# Patient Record
Sex: Female | Born: 1990 | Race: Black or African American | Hispanic: No | Marital: Single | Smoking: Never smoker
Health system: Southern US, Community
[De-identification: ages and names within clinical notes are randomized; demographics above are authoritative.]

## PROBLEM LIST (undated history)

## (undated) DIAGNOSIS — E669 Obesity, unspecified: Secondary | ICD-10-CM

## (undated) HISTORY — PX: TONSILLECTOMY AND ADENOIDECTOMY: SHX28

## (undated) HISTORY — DX: Obesity, unspecified: E66.9

---

## 2014-02-19 ENCOUNTER — Encounter (INDEPENDENT_AMBULATORY_CARE_PROVIDER_SITE_OTHER): Payer: Self-pay

## 2014-02-19 ENCOUNTER — Ambulatory Visit (INDEPENDENT_AMBULATORY_CARE_PROVIDER_SITE_OTHER): Payer: BLUE CROSS/BLUE SHIELD | Admitting: Family

## 2014-02-19 ENCOUNTER — Encounter: Payer: Self-pay | Admitting: Family

## 2014-02-19 VITALS — BP 138/84 | HR 98 | Temp 97.4°F | Ht 63.0 in | Wt 301.2 lb

## 2014-02-19 DIAGNOSIS — Z713 Dietary counseling and surveillance: Secondary | ICD-10-CM

## 2014-02-19 MED ORDER — NALTREXONE-BUPROPION HCL ER 8-90 MG PO TB12: ORAL_TABLET | ORAL | Status: DC

## 2014-02-19 NOTE — Patient Instructions (Signed)
Exercise to Lose Weight Exercise and a healthy diet may help you lose weight. Your doctor may suggest specific exercises. EXERCISE IDEAS AND TIPS  Choose low-cost things you enjoy doing, such as walking, bicycling, or exercising to workout videos.  Take stairs instead of the elevator.  Walk during your lunch break.  Park your car further away from work or school.  Go to a gym or an exercise class.  Start with 5 to 10 minutes of exercise each day. Build up to 30 minutes of exercise 4 to 6 days a week.  Wear shoes with good support and comfortable clothes.  Stretch before and after working out.  Work out until you breathe harder and your heart beats faster.  Drink extra water when you exercise.  Do not do so much that you hurt yourself, feel dizzy, or get very short of breath. Exercises that burn about 150 calories:  Running 1  miles in 15 minutes.  Playing volleyball for 45 to 60 minutes.  Washing and waxing a car for 45 to 60 minutes.  Playing touch football for 45 minutes.  Walking 1  miles in 35 minutes.  Pushing a stroller 1  miles in 30 minutes.  Playing basketball for 30 minutes.  Raking leaves for 30 minutes.  Bicycling 5 miles in 30 minutes.  Walking 2 miles in 30 minutes.  Dancing for 30 minutes.  Shoveling snow for 15 minutes.  Swimming laps for 20 minutes.  Walking up stairs for 15 minutes.  Bicycling 4 miles in 15 minutes.  Gardening for 30 to 45 minutes.  Jumping rope for 15 minutes.  Washing windows or floors for 45 to 60 minutes. Document Released: 02/20/2010 Document Revised: 04/12/2011 Document Reviewed: 02/20/2010 [employer] Patient Information 2015 [employer]. This information is not intended to replace advice given to you by your health care provider. Make sure you discuss any questions you have with your health care provider.  

## 2014-02-19 NOTE — Progress Notes (Signed)
   Subjective:    Patient ID: Daine Gip, female    DOB: 07-03-1990, 24 y.o.   MRN: [id number]  HPI Pt presents to the office for counseling for weight loss. Pt states she has tried [place] weight loss and dieting and exercising on her own with no results.  Pt would like to discuss medications to help her lose weight.    Review of Systems  Constitutional: Negative.   HENT: Negative.   Eyes: Negative.   Respiratory: Negative.  Negative for shortness of breath.   Cardiovascular: Negative.  Negative for palpitations.  Gastrointestinal: Negative.   Endocrine: Negative.   Genitourinary: Negative.   Musculoskeletal: Negative.   Neurological: Negative.  Negative for headaches.  Hematological: Negative.   Psychiatric/Behavioral: Negative.   All other systems reviewed and are negative.      Objective:   Physical Exam  Constitutional: She is oriented to person, place, and time. She appears well-developed and well-nourished. No distress.  HENT:  Head: Normocephalic and atraumatic.  Nose: Nose normal.  Mouth/Throat: Oropharynx is clear and moist.  Eyes: Pupils are equal, round, and reactive to light.  Neck: Normal range of motion. Neck supple. No thyromegaly present.  Cardiovascular: Normal rate, regular rhythm, normal heart sounds and intact distal pulses.   No murmur heard. Pulmonary/Chest: Effort normal and breath sounds normal. No respiratory distress. She has no wheezes.  Abdominal: Soft. Bowel sounds are normal. She exhibits no distension. There is no tenderness.  Musculoskeletal: Normal range of motion. She exhibits no edema or tenderness.  Neurological: She is alert and oriented to person, place, and time. She has normal reflexes. No cranial nerve deficit.  Skin: Skin is warm and dry.  Psychiatric: She has a normal mood and affect. Her behavior is normal. Judgment and thought content normal.  Vitals reviewed.  BP 138/84 mmHg  Pulse 98  Temp(Src) 97.4 F (36.3 C) (Oral)  Ht  5\' 3"  (1.6 m)  Wt 301 lb 3.2 oz (136.623 kg)  BMI 53.37 kg/m2  LMP 01/14/2014        Assessment & Plan:  1. Weight loss counseling, encounter for -Encouraged health diet and exercise-Lifestyle modifications -Will d/c medication in 12weeks if <5% weight loss -RTO 2 months - Naltrexone-Bupropion HCl ER 8-90 MG TB12; Take 1 tab PO q AM for one week, then 1 tab PO twice a day for one week, then 2 tabs in AM and 1 tab in evening for one week, then 2 tabs in AM and 2 tabs in evening. Max dose 4 tabs/day  Dispense: 120 tablet; Refill: 1  Take 1 tab PO q AM for one week, then 1 tab PO twice a day for one week, then 2 tabs in AM and 1 tab in evening for one week, then 2 tabs in AM and 2 tabs in evening. Max dose 4 tabs/day,  Evelina Dun, FNP

## 2014-02-21 ENCOUNTER — Other Ambulatory Visit: Payer: Self-pay | Admitting: Family

## 2014-02-21 MED ORDER — PHENTERMINE HCL 37.5 MG PO CAPS: 37.5000 mg | ORAL_CAPSULE | ORAL | Status: DC

## 2014-02-21 NOTE — Progress Notes (Signed)
Pt aware.

## 2014-02-27 ENCOUNTER — Encounter: Payer: Self-pay | Admitting: *Deleted

## 2014-03-01 ENCOUNTER — Telehealth: Payer: Self-pay | Admitting: Family

## 2014-03-01 NOTE — Telephone Encounter (Signed)
Paula Wells is working on now

## 2014-04-22 ENCOUNTER — Encounter: Payer: Self-pay | Admitting: Family

## 2014-04-22 ENCOUNTER — Ambulatory Visit (INDEPENDENT_AMBULATORY_CARE_PROVIDER_SITE_OTHER): Payer: BLUE CROSS/BLUE SHIELD | Admitting: Family

## 2014-04-22 VITALS — BP 132/98 | HR 70 | Temp 99.0°F | Ht 63.0 in | Wt 285.0 lb

## 2014-04-22 DIAGNOSIS — E669 Obesity, unspecified: Secondary | ICD-10-CM | POA: Diagnosis not present

## 2014-04-22 DIAGNOSIS — Z713 Dietary counseling and surveillance: Secondary | ICD-10-CM | POA: Diagnosis not present

## 2014-04-22 MED ORDER — PHENTERMINE HCL 37.5 MG PO CAPS: 37.5000 mg | ORAL_CAPSULE | ORAL | Status: DC

## 2014-04-22 NOTE — Patient Instructions (Signed)
Exercise to Lose Weight Exercise and a healthy diet may help you lose weight. Your doctor may suggest specific exercises. EXERCISE IDEAS AND TIPS  Choose low-cost things you enjoy doing, such as walking, bicycling, or exercising to workout videos.  Take stairs instead of the elevator.  Walk during your lunch break.  Park your car further away from work or school.  Go to a gym or an exercise class.  Start with 5 to 10 minutes of exercise each day. Build up to 30 minutes of exercise 4 to 6 days a week.  Wear shoes with good support and comfortable clothes.  Stretch before and after working out.  Work out until you breathe harder and your heart beats faster.  Drink extra water when you exercise.  Do not do so much that you hurt yourself, feel dizzy, or get very short of breath. Exercises that burn about 150 calories:  Running 1  miles in 15 minutes.  Playing volleyball for 45 to 60 minutes.  Washing and waxing a car for 45 to 60 minutes.  Playing touch football for 45 minutes.  Walking 1  miles in 35 minutes.  Pushing a stroller 1  miles in 30 minutes.  Playing basketball for 30 minutes.  Raking leaves for 30 minutes.  Bicycling 5 miles in 30 minutes.  Walking 2 miles in 30 minutes.  Dancing for 30 minutes.  Shoveling snow for 15 minutes.  Swimming laps for 20 minutes.  Walking up stairs for 15 minutes.  Bicycling 4 miles in 15 minutes.  Gardening for 30 to 45 minutes.  Jumping rope for 15 minutes.  Washing windows or floors for 45 to 60 minutes. Document Released: 02/20/2010 Document Revised: 04/12/2011 Document Reviewed: 02/20/2010 Exit[employer] Patient Information 2015 [employer]. This information is not intended to replace advice given to you by your health care provider. Make sure you discuss any questions you have with your health care provider. Calorie Counting for Weight Loss Calories are energy you get from the things you eat and drink. Your  body uses this energy to keep you going throughout the day. The number of calories you eat affects your weight. When you eat more calories than your body needs, your body stores the extra calories as fat. When you eat fewer calories than your body needs, your body burns fat to get the energy it needs. Calorie counting means keeping track of how many calories you eat and drink each day. If you make sure to eat fewer calories than your body needs, you should lose weight. In order for calorie counting to work, you will need to eat the number of calories that are right for you in a day to lose a healthy amount of weight per week. A healthy amount of weight to lose per week is usually 1-2 lb (0.5-0.9 kg). A dietitian can determine how many calories you need in a day and give you suggestions on how to reach your calorie goal.  WHAT IS MY MY PLAN? My goal is to have __________ calories per day.  If I have this many calories per day, I should lose around __________ pounds per week. WHAT DO I NEED TO KNOW ABOUT CALORIE COUNTING? In order to meet your daily calorie goal, you will need to:  Find out how many calories are in each food you would like to eat. Try to do this before you eat.  Decide how much of the food you can eat.  Write down what you ate and   how many calories it had. Doing this is called keeping a food log. WHERE DO I FIND CALORIE INFORMATION? The number of calories in a food can be found on a Nutrition Facts label. Note that all the information on a label is based on a specific serving of the food. If a food does not have a Nutrition Facts label, try to look up the calories online or ask your dietitian for help. HOW DO I DECIDE HOW MUCH TO EAT? To decide how much of the food you can eat, you will need to consider both the number of calories in one serving and the size of one serving. This information can be found on the Nutrition Facts label. If a food does not have a Nutrition Facts label, look  up the information online or ask your dietitian for help. Remember that calories are listed per serving. If you choose to have more than one serving of a food, you will have to multiply the calories per serving by the amount of servings you plan to eat. For example, the label on a package of bread might say that a serving size is 1 slice and that there are 90 calories in a serving. If you eat 1 slice, you will have eaten 90 calories. If you eat 2 slices, you will have eaten 180 calories. HOW DO I KEEP A FOOD LOG? After each meal, record the following information in your food log:  What you ate.  How much of it you ate.  How many calories it had.  Then, add up your calories. Keep your food log near you, such as in a small notebook in your pocket. Another option is to use a mobile app or website. Some programs will calculate calories for you and show you how many calories you have left each time you add an item to the log. WHAT ARE SOME CALORIE COUNTING TIPS?  Use your calories on foods and drinks that will fill you up and not leave you hungry. Some examples of this include foods like nuts and nut butters, vegetables, lean proteins, and high-fiber foods (more than 5 g fiber per serving).  Eat nutritious foods and avoid empty calories. Empty calories are calories you get from foods or beverages that do not have many nutrients, such as candy and soda. It is better to have a nutritious high-calorie food (such as an avocado) than a food with few nutrients (such as a bag of chips).  Know how many calories are in the foods you eat most often. This way, you do not have to look up how many calories they have each time you eat them.  Look out for foods that may seem like low-calorie foods but are really high-calorie foods, such as baked goods, soda, and fat-free candy.  Pay attention to calories in drinks. Drinks such as sodas, specialty coffee drinks, alcohol, and juices have a lot of calories yet do  not fill you up. Choose low-calorie drinks like water and diet drinks.  Focus your calorie counting efforts on higher calorie items. Logging the calories in a garden salad that contains only vegetables is less important than calculating the calories in a milk shake.  Find a way of tracking calories that works for you. Get creative. Most people who are successful find ways to keep track of how much they eat in a day, even if they do not count every calorie. WHAT ARE SOME PORTION CONTROL TIPS?  Know how many calories are in a   serving. This will help you know how many servings of a certain food you can have.  Use a measuring cup to measure serving sizes. This is helpful when you start out. With time, you will be able to estimate serving sizes for some foods.  Take some time to put servings of different foods on your favorite plates, bowls, and cups so you know what a serving looks like.  Try not to eat straight from a bag or box. Doing this can lead to overeating. Put the amount you would like to eat in a cup or on a plate to make sure you are eating the right portion.  Use smaller plates, glasses, and bowls to prevent overeating. This is a quick and easy way to practice portion control. If your plate is smaller, less food can fit on it.  Try not to multitask while eating, such as watching TV or using your computer. If it is time to eat, sit down at a table and enjoy your food. Doing this will help you to start recognizing when you are full. It will also make you more aware of what and how much you are eating. HOW CAN I CALORIE COUNT WHEN EATING OUT?  Ask for smaller portion sizes or child-sized portions.  Consider sharing an entree and sides instead of getting your own entree.  If you get your own entree, eat only half. Ask for a box at the beginning of your meal and put the rest of your entree in it so you are not tempted to eat it.  Look for the calories on the menu. If calories are listed,  choose the lower calorie options.  Choose dishes that include vegetables, fruits, whole grains, low-fat dairy products, and lean protein. Focusing on smart food choices from each of the 5 food groups can help you stay on track at restaurants.  Choose items that are boiled, broiled, grilled, or steamed.  Choose water, milk, unsweetened iced tea, or other drinks without added sugars. If you want an alcoholic beverage, choose a lower calorie option. For example, a regular margarita can have up to 700 calories and a glass of wine has around 150.  Stay away from items that are buttered, battered, fried, or served with cream sauce. Items labeled "crispy" are usually fried, unless stated otherwise.  Ask for dressings, sauces, and syrups on the side. These are usually very high in calories, so do not eat much of them.  Watch out for salads. Many people think salads are a healthy option, but this is often not the case. Many salads come with bacon, fried chicken, lots of cheese, fried chips, and dressing. All of these items have a lot of calories. If you want a salad, choose a garden salad and ask for grilled meats or steak. Ask for the dressing on the side, or ask for olive oil and vinegar or lemon to use as dressing.  Estimate how many servings of a food you are given. For example, a serving of cooked rice is  cup or about the size of half a tennis ball or one cupcake wrapper. Knowing serving sizes will help you be aware of how much food you are eating at restaurants. The list below tells you how big or small some common portion sizes are based on everyday objects.  1 oz--4 stacked dice.  3 oz--1 deck of cards.  1 tsp--1 dice.  1 Tbsp-- a Ping-Pong ball.  2 Tbsp--1 Ping-Pong ball.   cup--1 tennis ball   or 1 cupcake wrapper.  1 cup--1 baseball. Document Released: 01/18/2005 Document Revised: 06/04/2013 Document Reviewed: 11/23/2012 [facility] Patient Information 2015 [place], [place]. This  information is not intended to replace advice given to you by your health care provider. Make sure you discuss any questions you have with your health care provider. Phentermine tablets or capsules What is this medicine? PHENTERMINE (FEN ter meen) decreases your appetite. It is used with a reduced calorie diet and exercise to help you lose weight. This medicine may be used for other purposes; ask your health care provider or pharmacist if you have questions. COMMON BRAND NAME(S): Adipex-P, Atti-Plex P, Atti-Plex P Spansule, Fastin, Pro-Fast, Tara-8 What should I tell my health care provider before I take this medicine? They need to know if you have any of these conditions: -agitation -glaucoma -heart disease -high blood pressure -history of substance abuse -lung disease called Primary Pulmonary Hypertension (PPH) -taken an MAOI like Carbex, Eldepryl, Marplan, Nardil, or Parnate in last 14 days -thyroid disease -an unusual or allergic reaction to phentermine, other medicines, foods, dyes, or preservatives -pregnant or trying to get pregnant -breast-feeding How should I use this medicine? Take this medicine by mouth with a glass of water. Follow the directions on the prescription label. This medicine is usually taken 30 minutes before or 1 to 2 hours after breakfast. Avoid taking this medicine in the evening. It may interfere with sleep. Take your doses at regular intervals. Do not take your medicine more often than directed. Talk to your pediatrician regarding the use of this medicine in children. Special care may be needed. Overdosage: If you think you have taken too much of this medicine contact a poison control center or emergency room at once. NOTE: This medicine is only for you. Do not share this medicine with others. What if I miss a dose? If you miss a dose, take it as soon as you can. If it is almost time for your next dose, take only that dose. Do not take double or extra  doses. What may interact with this medicine? Do not take this medicine with any of the following medications: -duloxetine -MAOIs like Carbex, Eldepryl, Marplan, Nardil, and Parnate -medicines for colds or breathing difficulties like pseudoephedrine or phenylephrine -procarbazine -sibutramine -SSRIs like citalopram, escitalopram, fluoxetine, fluvoxamine, paroxetine, and sertraline -stimulants like dexmethylphenidate, methylphenidate or modafinil -venlafaxine This medicine may also interact with the following medications: -medicines for diabetes This list may not describe all possible interactions. Give your health care provider a list of all the medicines, herbs, non-prescription drugs, or dietary supplements you use. Also tell them if you smoke, drink alcohol, or use illegal drugs. Some items may interact with your medicine. What should I watch for while using this medicine? Notify your physician immediately if you become short of breath while doing your normal activities. Do not take this medicine within 6 hours of bedtime. It can keep you from getting to sleep. Avoid drinks that contain caffeine and try to stick to a regular bedtime every night. This medicine was intended to be used in addition to a healthy diet and exercise. The best results are achieved this way. This medicine is only indicated for short-term use. Eventually your weight loss may level out. At that point, the drug will only help you maintain your new weight. Do not increase or in any way change your dose without consulting your doctor. You may get drowsy or dizzy. Do not drive, use machinery, or do anything that needs mental alertness  until you know how this medicine affects you. Do not stand or sit up quickly, especially if you are an older patient. This reduces the risk of dizzy or fainting spells. Alcohol may increase dizziness and drowsiness. Avoid alcoholic drinks. What side effects may I notice from receiving this  medicine? Side effects that you should report to your doctor or health care professional as soon as possible: -chest pain, palpitations -depression or severe changes in mood -increased blood pressure -irritability -nervousness or restlessness -severe dizziness -shortness of breath -problems urinating -unusual swelling of the legs -vomiting Side effects that usually do not require medical attention (report to your doctor or health care professional if they continue or are bothersome): -blurred vision or other eye problems -changes in sexual ability or desire -constipation or diarrhea -difficulty sleeping -dry mouth or unpleasant taste -headache -nausea This list may not describe all possible side effects. Call your doctor for medical advice about side effects. You may report side effects to FDA at 1-800-FDA-1088. Where should I keep my medicine? Keep out of the reach of children. This medicine can be abused. Keep your medicine in a safe place to protect it from theft. Do not share this medicine with anyone. Selling or giving away this medicine is dangerous and against the law. Store at room temperature between 20 and 25 degrees C (68 and 77 degrees F). Keep container tightly closed. Throw away any unused medicine after the expiration date. NOTE: This sheet is a summary. It may not cover all possible information. If you have questions about this medicine, talk to your doctor, pharmacist, or health care provider.  2015, [employer]. (2010-03-04 11:02:44)

## 2014-04-22 NOTE — Progress Notes (Signed)
   Subjective:    Patient ID: Paula Wells, female    DOB: 08/07/1990, 24 y.o.   MRN: [id number]  HPI Pt presents to the office today to discussed how her weight loss has been going since being started on phentermine 37.5 mg. Pt states she has lost 16 lbs since starting the medication. Pt states she is exercising at least every other day. Pt states she is also trying to eat healthy.    Review of Systems  Constitutional: Negative.   HENT: Negative.   Eyes: Negative.   Respiratory: Negative.  Negative for shortness of breath.   Cardiovascular: Negative.  Negative for palpitations.  Gastrointestinal: Negative.   Endocrine: Negative.   Genitourinary: Negative.   Musculoskeletal: Negative.   Neurological: Negative.  Negative for headaches.  Hematological: Negative.   Psychiatric/Behavioral: Negative.   All other systems reviewed and are negative.      Objective:   Physical Exam  Constitutional: She is oriented to person, place, and time. She appears well-developed and well-nourished. No distress.  Eyes: Pupils are equal, round, and reactive to light.  Neck: Normal range of motion. Neck supple. No thyromegaly present.  Cardiovascular: Normal rate, regular rhythm, normal heart sounds and intact distal pulses.   No murmur heard. Pulmonary/Chest: Effort normal and breath sounds normal. No respiratory distress. She has no wheezes.  Abdominal: Soft. Bowel sounds are normal. She exhibits no distension. There is no tenderness.  Musculoskeletal: Normal range of motion. She exhibits no edema or tenderness.  Neurological: She is alert and oriented to person, place, and time. She has normal reflexes. No cranial nerve deficit.  Skin: Skin is warm and dry.  Psychiatric: She has a normal mood and affect. Her behavior is normal. Judgment and thought content normal.  Vitals reviewed.    BP 132/98 mmHg  Pulse 70  Temp(Src) 99 F (37.2 C) (Oral)  Ht $R'5\' 3"'lg$  (1.6 m)  Wt 285 lb (129.275 kg)   BMI 50.50 kg/m2  LMP 04/15/2014      Assessment & Plan:  1. Obesity - phentermine 37.5 MG capsule; Take 1 capsule (37.5 mg total) by mouth every morning.  Dispense: 90 capsule; Refill: 0 - CMP14+EGFR  2. Encounter for weight loss counseling -Encouraged healthy eating and diet -RTO in 3 months- if less than 5% of weight loss pt can not have medication refill - phentermine 37.5 MG capsule; Take 1 capsule (37.5 mg total) by mouth every morning.  Dispense: 90 capsule; Refill: 0 - CMP14+EGFR    Continue all meds Labs pending Health Maintenance reviewed Diet and exercise encouraged RTO 3 months  Evelina Dun, FNP

## 2014-04-23 ENCOUNTER — Ambulatory Visit: Payer: BLUE CROSS/BLUE SHIELD | Admitting: Family

## 2014-04-23 LAB — CMP14+EGFR
A/G RATIO: 1.5 (ref 1.1–2.5)
ALBUMIN: 4 g/dL (ref 3.5–5.5)
ALK PHOS: 79 IU/L (ref 39–117)
ALT: 30 IU/L (ref 0–32)
AST: 22 IU/L (ref 0–40)
BILIRUBIN TOTAL: 0.2 mg/dL (ref 0.0–1.2)
BUN / CREAT RATIO: 23 — AB (ref 8–20)
BUN: 14 mg/dL (ref 6–20)
CALCIUM: 9.4 mg/dL (ref 8.7–10.2)
CO2: 24 mmol/L (ref 18–29)
Chloride: 101 mmol/L (ref 97–108)
Creatinine, Ser: 0.62 mg/dL (ref 0.57–1.00)
GFR calc Af Amer: 147 mL/min/{1.73_m2} (ref >59)
GFR, EST NON AFRICAN AMERICAN: 128 mL/min/{1.73_m2} (ref >59)
GLOBULIN, TOTAL: 2.7 g/dL (ref 1.5–4.5)
GLUCOSE: 104 mg/dL — AB (ref 65–99)
POTASSIUM: 4.6 mmol/L (ref 3.5–5.2)
Sodium: 138 mmol/L (ref 134–144)
Total Protein: 6.7 g/dL (ref 6.0–8.5)

## 2014-06-03 ENCOUNTER — Telehealth: Payer: Self-pay | Admitting: Family

## 2014-06-03 DIAGNOSIS — E669 Obesity, unspecified: Secondary | ICD-10-CM

## 2014-06-03 DIAGNOSIS — Z713 Dietary counseling and surveillance: Secondary | ICD-10-CM

## 2014-06-03 MED ORDER — PHENTERMINE HCL 37.5 MG PO CAPS: 37.5000 mg | ORAL_CAPSULE | ORAL | Status: DC

## 2014-06-03 NOTE — Telephone Encounter (Signed)
RX ready for pick up 

## 2014-06-03 NOTE — Telephone Encounter (Signed)
Patient aware rx ready to be picked up 

## 2014-06-11 ENCOUNTER — Telehealth: Payer: Self-pay

## 2014-06-11 NOTE — Telephone Encounter (Signed)
Patient aware.

## 2014-06-11 NOTE — Telephone Encounter (Signed)
Insurance denied prior authorization for Phentermine due to the requested medication is covered when the patient has lost greater than or equal to 5% of baseline body weight   I called [facility] and a 30 day supply self pay is $27.00

## 2014-06-11 NOTE — Telephone Encounter (Signed)
Please let pt know. Thanks.

## 2014-07-23 ENCOUNTER — Ambulatory Visit: Payer: BLUE CROSS/BLUE SHIELD | Admitting: Family

## 2014-08-01 ENCOUNTER — Ambulatory Visit: Payer: BLUE CROSS/BLUE SHIELD | Admitting: Family

## 2014-08-08 ENCOUNTER — Ambulatory Visit (INDEPENDENT_AMBULATORY_CARE_PROVIDER_SITE_OTHER): Payer: BLUE CROSS/BLUE SHIELD | Admitting: Family

## 2014-08-08 ENCOUNTER — Encounter: Payer: Self-pay | Admitting: Family

## 2014-08-08 VITALS — BP 125/86 | HR 69 | Temp 98.2°F | Ht 63.0 in | Wt 270.4 lb

## 2014-08-08 DIAGNOSIS — Z713 Dietary counseling and surveillance: Secondary | ICD-10-CM | POA: Diagnosis not present

## 2014-08-08 DIAGNOSIS — E669 Obesity, unspecified: Secondary | ICD-10-CM | POA: Diagnosis not present

## 2014-08-08 MED ORDER — PHENTERMINE HCL 37.5 MG PO CAPS
37.5000 mg | ORAL_CAPSULE | ORAL | Status: DC
Start: 2014-08-08 — End: 2015-03-20

## 2014-08-08 NOTE — Patient Instructions (Signed)
Health Maintenance Adopting a healthy lifestyle and getting preventive care can go a long way to promote health and wellness. Talk with your health care provider about what schedule of regular examinations is right for you. This is a good chance for you to check in with your provider about disease prevention and staying healthy. In between checkups, there are plenty of things you can do on your own. Experts have done a lot of research about which lifestyle changes and preventive measures are most likely to keep you healthy. Ask your health care provider for more information. WEIGHT AND DIET  Eat a healthy diet  Be sure to include plenty of vegetables, fruits, low-fat dairy products, and lean protein.  Do not eat a lot of foods high in solid fats, added sugars, or salt.  Get regular exercise. This is one of the most important things you can do for your health.  Most adults should exercise for at least 150 minutes each week. The exercise should increase your heart rate and make you sweat (moderate-intensity exercise).  Most adults should also do strengthening exercises at least twice a week. This is in addition to the moderate-intensity exercise.  Maintain a healthy weight  Body mass index (BMI) is a measurement that can be used to identify possible weight problems. It estimates body fat based on height and weight. Your health care provider can help determine your BMI and help you achieve or maintain a healthy weight.  For females 25 years of age and older:   A BMI below 18.5 is considered underweight.  A BMI of 18.5 to 24.9 is normal.  A BMI of 25 to 29.9 is considered overweight.  A BMI of 30 and above is considered obese.  Watch levels of cholesterol and blood lipids  You should start having your blood tested for lipids and cholesterol at 23 years of age, then have this test every 5 years.  You may need to have your cholesterol levels checked more often if:  Your lipid or  cholesterol levels are high.  You are older than 23 years of age.  You are at high risk for heart disease.  CANCER SCREENING   Lung Cancer  Lung cancer screening is recommended for adults 97-92 years old who are at high risk for lung cancer because of a history of smoking.  A yearly low-dose CT scan of the lungs is recommended for people who:  Currently smoke.  Have quit within the past 15 years.  Have at least a 30-pack-year history of smoking. A pack year is smoking an average of one pack of cigarettes a day for 1 year.  Yearly screening should continue until it has been 15 years since you quit.  Yearly screening should stop if you develop a health problem that would prevent you from having lung cancer treatment.  Breast Cancer  Practice breast self-awareness. This means understanding how your breasts normally appear and feel.  It also means doing regular breast self-exams. Let your health care provider know about any changes, no matter how small.  If you are in your 20s or 30s, you should have a clinical breast exam (CBE) by a health care provider every 1-3 years as part of a regular health exam.  If you are 76 or older, have a CBE every year. Also consider having a breast X-ray (mammogram) every year.  If you have a family history of breast cancer, talk to your health care provider about genetic screening.  If you are  at high risk for breast cancer, talk to your health care provider about having an MRI and a mammogram every year.  Breast cancer gene (BRCA) assessment is recommended for women who have family members with BRCA-related cancers. BRCA-related cancers include:  Breast.  Ovarian.  Tubal.  Peritoneal cancers.  Results of the assessment will determine the need for genetic counseling and BRCA1 and BRCA2 testing. Cervical Cancer Routine pelvic examinations to screen for cervical cancer are no longer recommended for nonpregnant women who are considered low  risk for cancer of the pelvic organs (ovaries, uterus, and vagina) and who do not have symptoms. A pelvic examination may be necessary if you have symptoms including those associated with pelvic infections. Ask your health care provider if a screening pelvic exam is right for you.   The Pap test is the screening test for cervical cancer for women who are considered at risk.  If you had a hysterectomy for a problem that was not cancer or a condition that could lead to cancer, then you no longer need Pap tests.  If you are older than 65 years, and you have had normal Pap tests for the past 10 years, you no longer need to have Pap tests.  If you have had past treatment for cervical cancer or a condition that could lead to cancer, you need Pap tests and screening for cancer for at least 20 years after your treatment.  If you no longer get a Pap test, assess your risk factors if they change (such as having a new sexual partner). This can affect whether you should start being screened again.  Some women have medical problems that increase their chance of getting cervical cancer. If this is the case for you, your health care provider may recommend more frequent screening and Pap tests.  The human papillomavirus (HPV) test is another test that may be used for cervical cancer screening. The HPV test looks for the virus that can cause cell changes in the cervix. The cells collected during the Pap test can be tested for HPV.  The HPV test can be used to screen women 30 years of age and older. Getting tested for HPV can extend the interval between normal Pap tests from three to five years.  An HPV test also should be used to screen women of any age who have unclear Pap test results.  After 23 years of age, women should have HPV testing as often as Pap tests.  Colorectal Cancer  This type of cancer can be detected and often prevented.  Routine colorectal cancer screening usually begins at 23 years of  age and continues through 23 years of age.  Your health care provider may recommend screening at an earlier age if you have risk factors for colon cancer.  Your health care provider may also recommend using home test kits to check for hidden blood in the stool.  A small camera at the end of a tube can be used to examine your colon directly (sigmoidoscopy or colonoscopy). This is done to check for the earliest forms of colorectal cancer.  Routine screening usually begins at age 50.  Direct examination of the colon should be repeated every 5-10 years through 23 years of age. However, you may need to be screened more often if early forms of precancerous polyps or small growths are found. Skin Cancer  Check your skin from head to toe regularly.  Tell your health care provider about any new moles or changes in   moles, especially if there is a change in a mole's shape or color.  Also tell your health care provider if you have a mole that is larger than the size of a pencil eraser.  Always use sunscreen. Apply sunscreen liberally and repeatedly throughout the day.  Protect yourself by wearing long sleeves, pants, a wide-brimmed hat, and sunglasses whenever you are outside. HEART DISEASE, DIABETES, AND HIGH BLOOD PRESSURE   Have your blood pressure checked at least every 1-2 years. High blood pressure causes heart disease and increases the risk of stroke.  If you are between 75 years and 42 years old, ask your health care provider if you should take aspirin to prevent strokes.  Have regular diabetes screenings. This involves taking a blood sample to check your fasting blood sugar level.  If you are at a normal weight and have a low risk for diabetes, have this test once every three years after 23 years of age.  If you are overweight and have a high risk for diabetes, consider being tested at a younger age or more often. PREVENTING INFECTION  Hepatitis B  If you have a higher risk for  hepatitis B, you should be screened for this virus. You are considered at high risk for hepatitis B if:  You were born in a country where hepatitis B is common. Ask your health care provider which countries are considered high risk.  Your parents were born in a high-risk country, and you have not been immunized against hepatitis B (hepatitis B vaccine).  You have HIV or AIDS.  You use needles to inject street drugs.  You live with someone who has hepatitis B.  You have had sex with someone who has hepatitis B.  You get hemodialysis treatment.  You take certain medicines for conditions, including cancer, organ transplantation, and autoimmune conditions. Hepatitis C  Blood testing is recommended for:  Everyone born from [birth year] through 1964.  Anyone with known risk factors for hepatitis C. Sexually transmitted infections (STIs)  You should be screened for sexually transmitted infections (STIs) including gonorrhea and chlamydia if:  You are sexually active and are younger than 23 years of age.  You are older than 23 years of age and your health care provider tells you that you are at risk for this type of infection.  Your sexual activity has changed since you were last screened and you are at an increased risk for chlamydia or gonorrhea. Ask your health care provider if you are at risk.  If you do not have HIV, but are at risk, it may be recommended that you take a prescription medicine daily to prevent HIV infection. This is called pre-exposure prophylaxis (PrEP). You are considered at risk if:  You are sexually active and do not regularly use condoms or know the HIV status of your partner(s).  You take drugs by injection.  You are sexually active with a partner who has HIV. Talk with your health care provider about whether you are at high risk of being infected with HIV. If you choose to begin PrEP, you should first be tested for HIV. You should then be tested every 3 months for  as long as you are taking PrEP.  PREGNANCY   If you are premenopausal and you may become pregnant, ask your health care provider about preconception counseling.  If you may become pregnant, take 400 to 800 micrograms (mcg) of folic acid every day.  If you want to prevent pregnancy, talk to your  health care provider about birth control (contraception). OSTEOPOROSIS AND MENOPAUSE   Osteoporosis is a disease in which the bones lose minerals and strength with aging. This can result in serious bone fractures. Your risk for osteoporosis can be identified using a bone density scan.  If you are 64 years of age or older, or if you are at risk for osteoporosis and fractures, ask your health care provider if you should be screened.  Ask your health care provider whether you should take a calcium or vitamin D supplement to lower your risk for osteoporosis.  Menopause may have certain physical symptoms and risks.  Hormone replacement therapy may reduce some of these symptoms and risks. Talk to your health care provider about whether hormone replacement therapy is right for you.  HOME CARE INSTRUCTIONS   Schedule regular health, dental, and eye exams.  Stay current with your immunizations.   Do not use any tobacco products including cigarettes, chewing tobacco, or electronic cigarettes.  If you are pregnant, do not drink alcohol.  If you are breastfeeding, limit how much and how often you drink alcohol.  Limit alcohol intake to no more than 1 drink per day for nonpregnant women. One drink equals 12 ounces of beer, 5 ounces of wine, or 1 ounces of hard liquor.  Do not use street drugs.  Do not share needles.  Ask your health care provider for help if you need support or information about quitting drugs.  Tell your health care provider if you often feel depressed.  Tell your health care provider if you have ever been abused or do not feel safe at home. Document Released: 08/03/2010  Document Revised: 06/04/2013 Document Reviewed: 12/20/2012 [facility] Patient Information 2015 [place], [place]. This information is not intended to replace advice given to you by your health care provider. Make sure you discuss any questions you have with your health care provider. Calorie Counting for Weight Loss Calories are energy you get from the things you eat and drink. Your body uses this energy to keep you going throughout the day. The number of calories you eat affects your weight. When you eat more calories than your body needs, your body stores the extra calories as fat. When you eat fewer calories than your body needs, your body burns fat to get the energy it needs. Calorie counting means keeping track of how many calories you eat and drink each day. If you make sure to eat fewer calories than your body needs, you should lose weight. In order for calorie counting to work, you will need to eat the number of calories that are right for you in a day to lose a healthy amount of weight per week. A healthy amount of weight to lose per week is usually 1-2 lb (0.5-0.9 kg). A dietitian can determine how many calories you need in a day and give you suggestions on how to reach your calorie goal.  WHAT IS MY MY PLAN? My goal is to have __________ calories per day.  If I have this many calories per day, I should lose around __________ pounds per week. WHAT DO I NEED TO KNOW ABOUT CALORIE COUNTING? In order to meet your daily calorie goal, you will need to:  Find out how many calories are in each food you would like to eat. Try to do this before you eat.  Decide how much of the food you can eat.  Write down what you ate and how many calories it had. Doing this  is called keeping a food log. WHERE DO I FIND CALORIE INFORMATION? The number of calories in a food can be found on a Nutrition Facts label. Note that all the information on a label is based on a specific serving of the food. If a food does not  have a Nutrition Facts label, try to look up the calories online or ask your dietitian for help. HOW DO I DECIDE HOW MUCH TO EAT? To decide how much of the food you can eat, you will need to consider both the number of calories in one serving and the size of one serving. This information can be found on the Nutrition Facts label. If a food does not have a Nutrition Facts label, look up the information online or ask your dietitian for help. Remember that calories are listed per serving. If you choose to have more than one serving of a food, you will have to multiply the calories per serving by the amount of servings you plan to eat. For example, the label on a package of bread might say that a serving size is 1 slice and that there are 90 calories in a serving. If you eat 1 slice, you will have eaten 90 calories. If you eat 2 slices, you will have eaten 180 calories. HOW DO I KEEP A FOOD LOG? After each meal, record the following information in your food log:  What you ate.  How much of it you ate.  How many calories it had.  Then, add up your calories. Keep your food log near you, such as in a small notebook in your pocket. Another option is to use a mobile app or website. Some programs will calculate calories for you and show you how many calories you have left each time you add an item to the log. WHAT ARE SOME CALORIE COUNTING TIPS?  Use your calories on foods and drinks that will fill you up and not leave you hungry. Some examples of this include foods like nuts and nut butters, vegetables, lean proteins, and high-fiber foods (more than 5 g fiber per serving).  Eat nutritious foods and avoid empty calories. Empty calories are calories you get from foods or beverages that do not have many nutrients, such as candy and soda. It is better to have a nutritious high-calorie food (such as an avocado) than a food with few nutrients (such as a bag of chips).  Know how many calories are in the foods  you eat most often. This way, you do not have to look up how many calories they have each time you eat them.  Look out for foods that may seem like low-calorie foods but are really high-calorie foods, such as baked goods, soda, and fat-free candy.  Pay attention to calories in drinks. Drinks such as sodas, specialty coffee drinks, alcohol, and juices have a lot of calories yet do not fill you up. Choose low-calorie drinks like water and diet drinks.  Focus your calorie counting efforts on higher calorie items. Logging the calories in a garden salad that contains only vegetables is less important than calculating the calories in a milk shake.  Find a way of tracking calories that works for you. Get creative. Most people who are successful find ways to keep track of how much they eat in a day, even if they do not count every calorie. WHAT ARE SOME PORTION CONTROL TIPS?  Know how many calories are in a serving. This will help you know how  many servings of a certain food you can have.  Use a measuring cup to measure serving sizes. This is helpful when you start out. With time, you will be able to estimate serving sizes for some foods.  Take some time to put servings of different foods on your favorite plates, bowls, and cups so you know what a serving looks like.  Try not to eat straight from a bag or box. Doing this can lead to overeating. Put the amount you would like to eat in a cup or on a plate to make sure you are eating the right portion.  Use smaller plates, glasses, and bowls to prevent overeating. This is a quick and easy way to practice portion control. If your plate is smaller, less food can fit on it.  Try not to multitask while eating, such as watching TV or using your computer. If it is time to eat, sit down at a table and enjoy your food. Doing this will help you to start recognizing when you are full. It will also make you more aware of what and how much you are eating. HOW CAN I  CALORIE COUNT WHEN EATING OUT?  Ask for smaller portion sizes or child-sized portions.  Consider sharing an entree and sides instead of getting your own entree.  If you get your own entree, eat only half. Ask for a box at the beginning of your meal and put the rest of your entree in it so you are not tempted to eat it.  Look for the calories on the menu. If calories are listed, choose the lower calorie options.  Choose dishes that include vegetables, fruits, whole grains, low-fat dairy products, and lean protein. Focusing on smart food choices from each of the 5 food groups can help you stay on track at restaurants.  Choose items that are boiled, broiled, grilled, or steamed.  Choose water, milk, unsweetened iced tea, or other drinks without added sugars. If you want an alcoholic beverage, choose a lower calorie option. For example, a regular margarita can have up to 700 calories and a glass of wine has around 150.  Stay away from items that are buttered, battered, fried, or served with cream sauce. Items labeled "crispy" are usually fried, unless stated otherwise.  Ask for dressings, sauces, and syrups on the side. These are usually very high in calories, so do not eat much of them.  Watch out for salads. Many people think salads are a healthy option, but this is often not the case. Many salads come with bacon, fried chicken, lots of cheese, fried chips, and dressing. All of these items have a lot of calories. If you want a salad, choose a garden salad and ask for grilled meats or steak. Ask for the dressing on the side, or ask for olive oil and vinegar or lemon to use as dressing.  Estimate how many servings of a food you are given. For example, a serving of cooked rice is  cup or about the size of half a tennis ball or one cupcake wrapper. Knowing serving sizes will help you be aware of how much food you are eating at restaurants. The list below tells you how big or small some common  portion sizes are based on everyday objects.  1 oz--4 stacked dice.  3 oz--1 deck of cards.  1 tsp--1 dice.  1 Tbsp-- a Ping-Pong ball.  2 Tbsp--1 Ping-Pong ball.   cup--1 tennis ball or 1 cupcake wrapper.  1  cup--1 baseball. Document Released: 01/18/2005 Document Revised: 06/04/2013 Document Reviewed: 11/23/2012 [facility] Patient Information 2015 [place], [place]. This information is not intended to replace advice given to you by your health care provider. Make sure you discuss any questions you have with your health care provider.

## 2014-08-08 NOTE — Progress Notes (Signed)
   Subjective:    Patient ID: Paula Wells, female    DOB: 10/07/1990, 24 y.o.   MRN: [id number]  HPI Pt presents to the office today to discussed how her weight loss has been going since being started on phentermine 37.5 mg. Pt states she has lost 15 lbs since her last visit and 31 lb since starting the medication. Pt states she is exercising at least every other day. Pt states she is also trying to eat healthy.    Review of Systems  Constitutional: Negative.   HENT: Negative.   Eyes: Negative.   Respiratory: Negative.  Negative for shortness of breath.   Cardiovascular: Negative.  Negative for palpitations.  Gastrointestinal: Negative.   Endocrine: Negative.   Genitourinary: Negative.   Musculoskeletal: Negative.   Neurological: Negative.  Negative for headaches.  Hematological: Negative.   Psychiatric/Behavioral: Negative.   All other systems reviewed and are negative.      Objective:   Physical Exam  Constitutional: She is oriented to person, place, and time. She appears well-developed and well-nourished. No distress.  HENT:  Head: Normocephalic and atraumatic.  Right Ear: External ear normal.  Left Ear: External ear normal.  Nose: Nose normal.  Mouth/Throat: Oropharynx is clear and moist.  Eyes: Pupils are equal, round, and reactive to light.  Neck: Normal range of motion. Neck supple. No thyromegaly present.  Cardiovascular: Normal rate, regular rhythm, normal heart sounds and intact distal pulses.   No murmur heard. Pulmonary/Chest: Effort normal and breath sounds normal. No respiratory distress. She has no wheezes.  Abdominal: Soft. Bowel sounds are normal. She exhibits no distension. There is no tenderness.  Musculoskeletal: Normal range of motion. She exhibits no edema or tenderness.  Neurological: She is alert and oriented to person, place, and time. She has normal reflexes. No cranial nerve deficit.  Skin: Skin is warm and dry.  Psychiatric: She has a normal  mood and affect. Her behavior is normal. Judgment and thought content normal.  Vitals reviewed.     BP 125/86 mmHg  Pulse 69  Temp(Src) 98.2 F (36.8 C) (Oral)  Ht $R'5\' 3"'fr$  (1.6 m)  Wt 270 lb 6.4 oz (122.653 kg)  BMI 47.91 kg/m2  LMP 08/07/2014     Assessment & Plan:  1. Obesity - phentermine 37.5 MG capsule; Take 1 capsule (37.5 mg total) by mouth every morning.  Dispense: 90 capsule; Refill: 0 - CMP14+EGFR  2. Encounter for weight loss counseling -Encourage pt to continue exercising and healthy diet - phentermine 37.5 MG capsule; Take 1 capsule (37.5 mg total) by mouth every morning.  Dispense: 90 capsule; Refill: 0 - CMP14+EGFR   Continue all meds Labs pending Health Maintenance reviewed Diet and exercise encouraged RTO 3 months   Evelina Dun, FNP

## 2014-08-09 LAB — CMP14+EGFR
ALT: 17 IU/L (ref 0–32)
AST: 16 IU/L (ref 0–40)
Albumin/Globulin Ratio: 1.8 (ref 1.1–2.5)
Albumin: 4 g/dL (ref 3.5–5.5)
Alkaline Phosphatase: 66 IU/L (ref 39–117)
BUN/Creatinine Ratio: 19 (ref 8–20)
BUN: 12 mg/dL (ref 6–20)
Bilirubin Total: 0.4 mg/dL (ref 0.0–1.2)
CO2: 25 mmol/L (ref 18–29)
Calcium: 9.2 mg/dL (ref 8.7–10.2)
Chloride: 100 mmol/L (ref 97–108)
Creatinine, Ser: 0.62 mg/dL (ref 0.57–1.00)
GFR calc Af Amer: 147 mL/min/{1.73_m2} (ref >59)
GFR calc non Af Amer: 128 mL/min/{1.73_m2} (ref >59)
Globulin, Total: 2.2 g/dL (ref 1.5–4.5)
Glucose: 99 mg/dL (ref 65–99)
Potassium: 4 mmol/L (ref 3.5–5.2)
Sodium: 140 mmol/L (ref 134–144)
Total Protein: 6.2 g/dL (ref 6.0–8.5)

## 2014-09-19 ENCOUNTER — Telehealth: Payer: Self-pay | Admitting: Family

## 2014-10-18 ENCOUNTER — Telehealth: Payer: Self-pay | Admitting: Family

## 2014-10-18 NOTE — Telephone Encounter (Signed)
Patient aware that we have not received the prior authorization from [facility].

## 2014-11-11 ENCOUNTER — Ambulatory Visit: Payer: BLUE CROSS/BLUE SHIELD | Admitting: Family

## 2014-11-21 ENCOUNTER — Ambulatory Visit: Payer: BLUE CROSS/BLUE SHIELD | Admitting: Family

## 2014-12-16 ENCOUNTER — Telehealth: Payer: Self-pay

## 2014-12-16 NOTE — Telephone Encounter (Signed)
Insurance denied Phentermine    Says requested medication is covered when the patient lost greater than or equal to 5% baseline body weight

## 2014-12-16 NOTE — Telephone Encounter (Signed)
lmtcb to inform pt that ins will not pay for phentermine, that she could pay for it out of pocket.

## 2014-12-16 NOTE — Telephone Encounter (Signed)
Please let patient know that Insurance denied Phentermine Says requested medication is covered when the patient lost greater than or equal to 5% baseline body weight

## 2015-03-20 ENCOUNTER — Ambulatory Visit (INDEPENDENT_AMBULATORY_CARE_PROVIDER_SITE_OTHER): Payer: BLUE CROSS/BLUE SHIELD | Admitting: Family

## 2015-03-20 ENCOUNTER — Encounter: Payer: Self-pay | Admitting: Family

## 2015-03-20 VITALS — BP 111/75 | HR 63 | Temp 97.2°F | Ht 63.0 in | Wt 250.6 lb

## 2015-03-20 DIAGNOSIS — Z713 Dietary counseling and surveillance: Secondary | ICD-10-CM

## 2015-03-20 DIAGNOSIS — Z23 Encounter for immunization: Secondary | ICD-10-CM | POA: Diagnosis not present

## 2015-03-20 DIAGNOSIS — E669 Obesity, unspecified: Secondary | ICD-10-CM

## 2015-03-20 MED ORDER — PHENTERMINE HCL 37.5 MG PO CAPS: 37.5000 mg | ORAL_CAPSULE | ORAL | Status: DC

## 2015-03-20 NOTE — Progress Notes (Signed)
   Subjective:    Patient ID: Paula Wells, female    DOB: 05/20/1990, 25 y.o.   MRN: [id number]  HPI  Pt presents to the office today to discussed weight loss. Pt started losing weight in 2016 and lost a total of 61 lbs. Pt states she was takng phentermine 37.5 mg that helped, but has not taken any since September. PT states since stopping phentermine she has only lost a few pounds. PT would like to restart the phentermine today,     Review of Systems  Constitutional: Negative.   HENT: Negative.   Eyes: Negative.   Respiratory: Negative.  Negative for shortness of breath.   Cardiovascular: Negative.  Negative for palpitations.  Gastrointestinal: Negative.   Endocrine: Negative.   Genitourinary: Negative.   Musculoskeletal: Negative.   Neurological: Negative.  Negative for headaches.  Hematological: Negative.   Psychiatric/Behavioral: Negative.   All other systems reviewed and are negative.      Objective:   Physical Exam  Constitutional: She is oriented to person, place, and time. She appears well-developed and well-nourished. No distress.  Obese   HENT:  Head: Normocephalic and atraumatic.  Right Ear: External ear normal.  Left Ear: External ear normal.  Nose: Nose normal.  Mouth/Throat: Oropharynx is clear and moist.  Eyes: Pupils are equal, round, and reactive to light.  Neck: Normal range of motion. Neck supple. No thyromegaly present.  Cardiovascular: Normal rate, regular rhythm, normal heart sounds and intact distal pulses.   No murmur heard. Pulmonary/Chest: Effort normal and breath sounds normal. No respiratory distress. She has no wheezes.  Abdominal: Soft. Bowel sounds are normal. She exhibits no distension. There is no tenderness.  Musculoskeletal: Normal range of motion. She exhibits no edema or tenderness.  Neurological: She is alert and oriented to person, place, and time. She has normal reflexes. No cranial nerve deficit.  Skin: Skin is warm and dry.    Psychiatric: She has a normal mood and affect. Her behavior is normal. Judgment and thought content normal.  Vitals reviewed.     BP 111/75 mmHg  Pulse 63  Temp(Src) 97.2 F (36.2 C) (Oral)  Ht '5\' 3"'$  (1.6 m)  Wt 250 lb 9.6 oz (113.671 kg)  BMI 44.40 kg/m2     Assessment & Plan:  1. Obesity - phentermine 37.5 MG capsule; Take 1 capsule (37.5 mg total) by mouth every morning.  Dispense: 90 capsule; Refill: 0 - CMP14+EGFR  2. Encounter for weight loss counseling -Phentermine restarted today -Weight loss encouraged - phentermine 37.5 MG capsule; Take 1 capsule (37.5 mg total) by mouth every morning.  Dispense: 90 capsule; Refill: 0 - CMP14+EGFR   Continue all meds Labs pending Health Maintenance reviewed-Flu vaccine given today Diet and exercise encouraged RTO 3 months  Evelina Dun, FNP

## 2015-03-20 NOTE — Patient Instructions (Signed)

## 2015-03-21 LAB — CMP14+EGFR
ALBUMIN: 4.1 g/dL (ref 3.5–5.5)
ALK PHOS: 55 IU/L (ref 39–117)
ALT: 11 IU/L (ref 0–32)
AST: 17 IU/L (ref 0–40)
Albumin/Globulin Ratio: 1.9 (ref 1.1–2.5)
BUN / CREAT RATIO: 22 — AB (ref 8–20)
BUN: 13 mg/dL (ref 6–20)
Bilirubin Total: 0.2 mg/dL (ref 0.0–1.2)
CALCIUM: 9.2 mg/dL (ref 8.7–10.2)
CO2: 24 mmol/L (ref 18–29)
CREATININE: 0.59 mg/dL (ref 0.57–1.00)
Chloride: 97 mmol/L (ref 96–106)
GFR calc Af Amer: 148 mL/min/{1.73_m2} (ref >59)
GFR, EST NON AFRICAN AMERICAN: 129 mL/min/{1.73_m2} (ref >59)
GLOBULIN, TOTAL: 2.2 g/dL (ref 1.5–4.5)
Glucose: 87 mg/dL (ref 65–99)
Potassium: 3.8 mmol/L (ref 3.5–5.2)
SODIUM: 136 mmol/L (ref 134–144)
Total Protein: 6.3 g/dL (ref 6.0–8.5)

## 2015-03-27 ENCOUNTER — Telehealth: Payer: Self-pay

## 2015-03-27 NOTE — Telephone Encounter (Signed)
Insurance prior authorized Phentermine through 06/22/15

## 2015-03-28 ENCOUNTER — Encounter: Payer: Self-pay | Admitting: *Deleted

## 2015-06-02 ENCOUNTER — Ambulatory Visit: Payer: BLUE CROSS/BLUE SHIELD | Admitting: Family

## 2015-06-17 ENCOUNTER — Ambulatory Visit (INDEPENDENT_AMBULATORY_CARE_PROVIDER_SITE_OTHER): Payer: BLUE CROSS/BLUE SHIELD | Admitting: Family

## 2015-06-17 ENCOUNTER — Encounter: Payer: Self-pay | Admitting: Family

## 2015-06-17 VITALS — BP 114/79 | HR 80 | Temp 98.8°F | Ht 63.0 in | Wt 238.2 lb

## 2015-06-17 DIAGNOSIS — Z0000 Encounter for general adult medical examination without abnormal findings: Secondary | ICD-10-CM | POA: Diagnosis not present

## 2015-06-17 DIAGNOSIS — Z01419 Encounter for gynecological examination (general) (routine) without abnormal findings: Secondary | ICD-10-CM

## 2015-06-17 DIAGNOSIS — E669 Obesity, unspecified: Secondary | ICD-10-CM

## 2015-06-17 DIAGNOSIS — Z713 Dietary counseling and surveillance: Secondary | ICD-10-CM

## 2015-06-17 MED ORDER — PHENTERMINE HCL 37.5 MG PO CAPS: 37.5000 mg | ORAL_CAPSULE | ORAL | Status: DC

## 2015-06-17 NOTE — Progress Notes (Signed)
   Subjective:    Patient ID: Paula Wells, female    DOB: [birth year], 25 y.o.   MRN: [id number]  HPI PT presents to the office today for CPE and pap. PT currently taking phentermine 37.5 mg everyday for weight lost. PT states she has lost 10 lb since her last visit and continues to go the the gym 2-3 times a week and doing well. Pt denies any headache, palpitations, SOB, or edema at this time. PT states she is not sexually active and is a virgin.     Review of Systems  Constitutional: Negative.   HENT: Negative.   Eyes: Negative.   Respiratory: Negative.  Negative for shortness of breath.   Cardiovascular: Negative.  Negative for palpitations.  Gastrointestinal: Negative.   Endocrine: Negative.   Genitourinary: Negative.   Musculoskeletal: Negative.   Neurological: Negative.  Negative for headaches.  Hematological: Negative.   Psychiatric/Behavioral: Negative.   All other systems reviewed and are negative.      Objective:   Physical Exam  Constitutional: She is oriented to person, place, and time. She appears well-developed and well-nourished. No distress.  HENT:  Head: Normocephalic and atraumatic.  Right Ear: External ear normal.  Left Ear: External ear normal.  Nose: Nose normal.  Mouth/Throat: Oropharynx is clear and moist.  Eyes: Pupils are equal, round, and reactive to light.  Neck: Normal range of motion. Neck supple. No thyromegaly present.  Cardiovascular: Normal rate, regular rhythm, normal heart sounds and intact distal pulses.   No murmur heard. Pulmonary/Chest: Effort normal and breath sounds normal. No respiratory distress. She has no wheezes. Right breast exhibits no inverted nipple, no mass, no nipple discharge, no skin change and no tenderness. Left breast exhibits no inverted nipple, no mass, no nipple discharge, no skin change and no tenderness. Breasts are symmetrical.  Abdominal: Soft. Bowel sounds are normal. She exhibits no distension. There is no  tenderness.  Genitourinary: Vagina normal.  Bimanual exam- no adnexal masses or tenderness, ovaries nonpalpable   Cervix parous and pink- No discharge   Musculoskeletal: Normal range of motion. She exhibits no edema or tenderness.  Neurological: She is alert and oriented to person, place, and time. She has normal reflexes. No cranial nerve deficit.  Skin: Skin is warm and dry.  Psychiatric: She has a normal mood and affect. Her behavior is normal. Judgment and thought content normal.  Vitals reviewed.   BP 114/79 mmHg  Pulse 80  Temp(Src) 98.8 F (37.1 C) (Oral)  Ht '5\' 3"'$  (1.6 m)  Wt 238 lb 3.2 oz (108.047 kg)  BMI 42.21 kg/m2  LMP 06/16/2015       Assessment & Plan:  1. Annual physical exam - Anemia Profile B - CMP14+EGFR - Lipid panel - Thyroid Panel With TSH - VITAMIN D 25 Hydroxy (Vit-D Deficiency, Fractures) - Pap IG, CT/NG w/ reflex HPV when ASC-U  2. Encounter for routine gynecological examination - Pap IG, CT/NG w/ reflex HPV when ASC-U  3. Obesity -Continue exercise and diet - phentermine 37.5 MG capsule; Take 1 capsule (37.5 mg total) by mouth every morning.  Dispense: 90 capsule; Refill: 0  4. Encounter for weight loss counseling - phentermine 37.5 MG capsule; Take 1 capsule (37.5 mg total) by mouth every morning.  Dispense: 90 capsule; Refill: 0   Continue all meds Labs pending Health Maintenance reviewed Diet and exercise encouraged RTO 3 months  Evelina Dun, FNP

## 2015-06-17 NOTE — Patient Instructions (Signed)
Health Maintenance, Female Adopting a healthy lifestyle and getting preventive care can go a long way to promote health and wellness. Talk with your health care provider about what schedule of regular examinations is right for you. This is a good chance for you to check in with your provider about disease prevention and staying healthy. In between checkups, there are plenty of things you can do on your own. Experts have done a lot of research about which lifestyle changes and preventive measures are most likely to keep you healthy. Ask your health care provider for more information. WEIGHT AND DIET  Eat a healthy diet  Be sure to include plenty of vegetables, fruits, low-fat dairy products, and lean protein.  Do not eat a lot of foods high in solid fats, added sugars, or salt.  Get regular exercise. This is one of the most important things you can do for your health.  Most adults should exercise for at least 150 minutes each week. The exercise should increase your heart rate and make you sweat (moderate-intensity exercise).  Most adults should also do strengthening exercises at least twice a week. This is in addition to the moderate-intensity exercise.  Maintain a healthy weight  Body mass index (BMI) is a measurement that can be used to identify possible weight problems. It estimates body fat based on height and weight. Your health care provider can help determine your BMI and help you achieve or maintain a healthy weight.  For females 20 years of age and older:   A BMI below 18.5 is considered underweight.  A BMI of 18.5 to 24.9 is normal.  A BMI of 25 to 29.9 is considered overweight.  A BMI of 30 and above is considered obese.  Watch levels of cholesterol and blood lipids  You should start having your blood tested for lipids and cholesterol at 24 years of age, then have this test every 5 years.  You may need to have your cholesterol levels checked more often if:  Your lipid  or cholesterol levels are high.  You are older than 24 years of age.  You are at high risk for heart disease.  CANCER SCREENING   Lung Cancer  Lung cancer screening is recommended for adults 55-80 years old who are at high risk for lung cancer because of a history of smoking.  A yearly low-dose CT scan of the lungs is recommended for people who:  Currently smoke.  Have quit within the past 15 years.  Have at least a 30-pack-year history of smoking. A pack year is smoking an average of one pack of cigarettes a day for 1 year.  Yearly screening should continue until it has been 15 years since you quit.  Yearly screening should stop if you develop a health problem that would prevent you from having lung cancer treatment.  Breast Cancer  Practice breast self-awareness. This means understanding how your breasts normally appear and feel.  It also means doing regular breast self-exams. Let your health care provider know about any changes, no matter how small.  If you are in your 20s or 30s, you should have a clinical breast exam (CBE) by a health care provider every 1-3 years as part of a regular health exam.  If you are 40 or older, have a CBE every year. Also consider having a breast X-ray (mammogram) every year.  If you have a family history of breast cancer, talk to your health care provider about genetic screening.  If you   are at high risk for breast cancer, talk to your health care provider about having an MRI and a mammogram every year.  Breast cancer gene (BRCA) assessment is recommended for women who have family members with BRCA-related cancers. BRCA-related cancers include:  Breast.  Ovarian.  Tubal.  Peritoneal cancers.  Results of the assessment will determine the need for genetic counseling and BRCA1 and BRCA2 testing. Cervical Cancer Your health care provider may recommend that you be screened regularly for cancer of the pelvic organs (ovaries, uterus, and  vagina). This screening involves a pelvic examination, including checking for microscopic changes to the surface of your cervix (Pap test). You may be encouraged to have this screening done every 3 years, beginning at age 21.  For women ages 30-65, health care providers may recommend pelvic exams and Pap testing every 3 years, or they may recommend the Pap and pelvic exam, combined with testing for human papilloma virus (HPV), every 5 years. Some types of HPV increase your risk of cervical cancer. Testing for HPV may also be done on women of any age with unclear Pap test results.  Other health care providers may not recommend any screening for nonpregnant women who are considered low risk for pelvic cancer and who do not have symptoms. Ask your health care provider if a screening pelvic exam is right for you.  If you have had past treatment for cervical cancer or a condition that could lead to cancer, you need Pap tests and screening for cancer for at least 20 years after your treatment. If Pap tests have been discontinued, your risk factors (such as having a new sexual partner) need to be reassessed to determine if screening should resume. Some women have medical problems that increase the chance of getting cervical cancer. In these cases, your health care provider may recommend more frequent screening and Pap tests. Colorectal Cancer  This type of cancer can be detected and often prevented.  Routine colorectal cancer screening usually begins at 24 years of age and continues through 24 years of age.  Your health care provider may recommend screening at an earlier age if you have risk factors for colon cancer.  Your health care provider may also recommend using home test kits to check for hidden blood in the stool.  A small camera at the end of a tube can be used to examine your colon directly (sigmoidoscopy or colonoscopy). This is done to check for the earliest forms of colorectal  cancer.  Routine screening usually begins at age 50.  Direct examination of the colon should be repeated every 5-10 years through 24 years of age. However, you may need to be screened more often if early forms of precancerous polyps or small growths are found. Skin Cancer  Check your skin from head to toe regularly.  Tell your health care provider about any new moles or changes in moles, especially if there is a change in a mole's shape or color.  Also tell your health care provider if you have a mole that is larger than the size of a pencil eraser.  Always use sunscreen. Apply sunscreen liberally and repeatedly throughout the day.  Protect yourself by wearing long sleeves, pants, a wide-brimmed hat, and sunglasses whenever you are outside. HEART DISEASE, DIABETES, AND HIGH BLOOD PRESSURE   High blood pressure causes heart disease and increases the risk of stroke. High blood pressure is more likely to develop in:  People who have blood pressure in the high end   of the normal range (130-139/85-89 mm Hg).  People who are overweight or obese.  People who are African American.  If you are 38-23 years of age, have your blood pressure checked every 3-5 years. If you are 61 years of age or older, have your blood pressure checked every year. You should have your blood pressure measured twice--once when you are at a hospital or clinic, and once when you are not at a hospital or clinic. Record the average of the two measurements. To check your blood pressure when you are not at a hospital or clinic, you can use:  An automated blood pressure machine at a pharmacy.  A home blood pressure monitor.  If you are between 45 years and 39 years old, ask your health care provider if you should take aspirin to prevent strokes.  Have regular diabetes screenings. This involves taking a blood sample to check your fasting blood sugar level.  If you are at a normal weight and have a low risk for diabetes,  have this test once every three years after 24 years of age.  If you are overweight and have a high risk for diabetes, consider being tested at a younger age or more often. PREVENTING INFECTION  Hepatitis B  If you have a higher risk for hepatitis B, you should be screened for this virus. You are considered at high risk for hepatitis B if:  You were born in a country where hepatitis B is common. Ask your health care provider which countries are considered high risk.  Your parents were born in a high-risk country, and you have not been immunized against hepatitis B (hepatitis B vaccine).  You have HIV or AIDS.  You use needles to inject street drugs.  You live with someone who has hepatitis B.  You have had sex with someone who has hepatitis B.  You get hemodialysis treatment.  You take certain medicines for conditions, including cancer, organ transplantation, and autoimmune conditions. Hepatitis C  Blood testing is recommended for:  Everyone born from [birth year] through 1964.  Anyone with known risk factors for hepatitis C. Sexually transmitted infections (STIs)  You should be screened for sexually transmitted infections (STIs) including gonorrhea and chlamydia if:  You are sexually active and are younger than 24 years of age.  You are older than 24 years of age and your health care provider tells you that you are at risk for this type of infection.  Your sexual activity has changed since you were last screened and you are at an increased risk for chlamydia or gonorrhea. Ask your health care provider if you are at risk.  If you do not have HIV, but are at risk, it may be recommended that you take a prescription medicine daily to prevent HIV infection. This is called pre-exposure prophylaxis (PrEP). You are considered at risk if:  You are sexually active and do not regularly use condoms or know the HIV status of your partner(s).  You take drugs by injection.  You are sexually  active with a partner who has HIV. Talk with your health care provider about whether you are at high risk of being infected with HIV. If you choose to begin PrEP, you should first be tested for HIV. You should then be tested every 3 months for as long as you are taking PrEP.  PREGNANCY   If you are premenopausal and you may become pregnant, ask your health care provider about preconception counseling.  If you may  become pregnant, take 400 to 800 micrograms (mcg) of folic acid every day.  If you want to prevent pregnancy, talk to your health care provider about birth control (contraception). OSTEOPOROSIS AND MENOPAUSE   Osteoporosis is a disease in which the bones lose minerals and strength with aging. This can result in serious bone fractures. Your risk for osteoporosis can be identified using a bone density scan.  If you are 61 years of age or older, or if you are at risk for osteoporosis and fractures, ask your health care provider if you should be screened.  Ask your health care provider whether you should take a calcium or vitamin D supplement to lower your risk for osteoporosis.  Menopause may have certain physical symptoms and risks.  Hormone replacement therapy may reduce some of these symptoms and risks. Talk to your health care provider about whether hormone replacement therapy is right for you.  HOME CARE INSTRUCTIONS   Schedule regular health, dental, and eye exams.  Stay current with your immunizations.   Do not use any tobacco products including cigarettes, chewing tobacco, or electronic cigarettes.  If you are pregnant, do not drink alcohol.  If you are breastfeeding, limit how much and how often you drink alcohol.  Limit alcohol intake to no more than 1 drink per day for nonpregnant women. One drink equals 12 ounces of beer, 5 ounces of wine, or 1 ounces of hard liquor.  Do not use street drugs.  Do not share needles.  Ask your health care provider for help if  you need support or information about quitting drugs.  Tell your health care provider if you often feel depressed.  Tell your health care provider if you have ever been abused or do not feel safe at home.   This information is not intended to replace advice given to you by your health care provider. Make sure you discuss any questions you have with your health care provider.   Document Released: 08/03/2010 Document Revised: 02/08/2014 Document Reviewed: 12/20/2012 Elsevier Interactive Patient Education [employer].

## 2015-06-18 LAB — ANEMIA PROFILE B
BASOS ABS: 0 10*3/uL (ref 0.0–0.2)
BASOS: 1 %
EOS (ABSOLUTE): 0.1 10*3/uL (ref 0.0–0.4)
Eos: 2 %
Ferritin: 15 ng/mL (ref 15–150)
Folate: 11 ng/mL (ref >3.0)
HEMATOCRIT: 36.9 % (ref 34.0–46.6)
Hemoglobin: 12.2 g/dL (ref 11.1–15.9)
IMMATURE GRANS (ABS): 0 10*3/uL (ref 0.0–0.1)
Immature Granulocytes: 0 %
Iron Saturation: 27 % (ref 15–55)
Iron: 114 ug/dL (ref 27–159)
Lymphocytes Absolute: 2.9 10*3/uL (ref 0.7–3.1)
Lymphs: 46 %
MCH: 27.9 pg (ref 26.6–33.0)
MCHC: 33.1 g/dL (ref 31.5–35.7)
MCV: 84 fL (ref 79–97)
MONOCYTES: 12 %
Monocytes Absolute: 0.8 10*3/uL (ref 0.1–0.9)
Neutrophils Absolute: 2.5 10*3/uL (ref 1.4–7.0)
Neutrophils: 39 %
Platelets: 507 10*3/uL — ABNORMAL HIGH (ref 150–379)
RBC: 4.38 x10E6/uL (ref 3.77–5.28)
RDW: 14.5 % (ref 12.3–15.4)
RETIC CT PCT: 0.6 % (ref 0.6–2.6)
TIBC: 427 ug/dL (ref 250–450)
UIBC: 313 ug/dL (ref 131–425)
Vitamin B-12: 485 pg/mL (ref 211–946)
WBC: 6.3 10*3/uL (ref 3.4–10.8)

## 2015-06-18 LAB — CMP14+EGFR
ALBUMIN: 4.4 g/dL (ref 3.5–5.5)
ALK PHOS: 60 IU/L (ref 39–117)
ALT: 9 IU/L (ref 0–32)
AST: 18 IU/L (ref 0–40)
Albumin/Globulin Ratio: 1.8 (ref 1.2–2.2)
BUN / CREAT RATIO: 21 (ref 9–23)
BUN: 14 mg/dL (ref 6–20)
Bilirubin Total: 0.3 mg/dL (ref 0.0–1.2)
CO2: 25 mmol/L (ref 18–29)
CREATININE: 0.66 mg/dL (ref 0.57–1.00)
Calcium: 9.3 mg/dL (ref 8.7–10.2)
Chloride: 96 mmol/L (ref 96–106)
GFR calc Af Amer: 143 mL/min/{1.73_m2} (ref >59)
GFR calc non Af Amer: 124 mL/min/{1.73_m2} (ref >59)
GLUCOSE: 94 mg/dL (ref 65–99)
Globulin, Total: 2.4 g/dL (ref 1.5–4.5)
Potassium: 4.6 mmol/L (ref 3.5–5.2)
Sodium: 137 mmol/L (ref 134–144)
Total Protein: 6.8 g/dL (ref 6.0–8.5)

## 2015-06-18 LAB — VITAMIN D 25 HYDROXY (VIT D DEFICIENCY, FRACTURES): Vit D, 25-Hydroxy: 7.9 ng/mL — ABNORMAL LOW (ref 30.0–100.0)

## 2015-06-18 LAB — THYROID PANEL WITH TSH
Free Thyroxine Index: 1.6 (ref 1.2–4.9)
T3 Uptake Ratio: 29 % (ref 24–39)
T4, Total: 5.4 ug/dL (ref 4.5–12.0)
TSH: 1.55 u[IU]/mL (ref 0.450–4.500)

## 2015-06-18 LAB — LIPID PANEL
CHOLESTEROL TOTAL: 201 mg/dL — AB (ref 100–199)
Chol/HDL Ratio: 2.1 ratio units (ref 0.0–4.4)
HDL: 95 mg/dL (ref >39)
LDL CALC: 95 mg/dL (ref 0–99)
TRIGLYCERIDES: 57 mg/dL (ref 0–149)
VLDL CHOLESTEROL CAL: 11 mg/dL (ref 5–40)

## 2015-06-20 ENCOUNTER — Other Ambulatory Visit: Payer: Self-pay | Admitting: Family

## 2015-06-20 LAB — PAP IG, CT-NG, RFX HPV ASCU
Chlamydia, Nuc. Acid Amp: NEGATIVE
GONOCOCCUS BY NUCLEIC ACID AMP: NEGATIVE
PAP Smear Comment: 0

## 2015-06-20 MED ORDER — VITAMIN D (ERGOCALCIFEROL) 1.25 MG (50000 UNIT) PO CAPS: 50000.0000 [IU] | ORAL_CAPSULE | ORAL | Status: DC

## 2015-06-24 ENCOUNTER — Other Ambulatory Visit: Payer: BLUE CROSS/BLUE SHIELD | Admitting: Family

## 2015-07-23 ENCOUNTER — Telehealth: Payer: Self-pay | Admitting: Family

## 2015-08-04 NOTE — Telephone Encounter (Signed)
Insurance denied Phentermine  Needs to have had a weight loss of greater than 5% to continue therapy

## 2015-09-18 ENCOUNTER — Encounter: Payer: Self-pay | Admitting: Family

## 2015-09-18 ENCOUNTER — Ambulatory Visit (INDEPENDENT_AMBULATORY_CARE_PROVIDER_SITE_OTHER): Payer: BLUE CROSS/BLUE SHIELD | Admitting: Family

## 2015-09-18 DIAGNOSIS — E669 Obesity, unspecified: Secondary | ICD-10-CM

## 2015-09-18 DIAGNOSIS — Z713 Dietary counseling and surveillance: Secondary | ICD-10-CM

## 2015-09-18 MED ORDER — PHENTERMINE HCL 37.5 MG PO CAPS: 37.5000 mg | ORAL_CAPSULE | ORAL | 0 refills | Status: DC

## 2015-09-18 NOTE — Patient Instructions (Signed)

## 2015-09-18 NOTE — Progress Notes (Signed)
   Subjective:    Patient ID: Paula Wells, female    DOB: 1990-11-14, 25 y.o.   MRN: [id number]  HPI Pt presents to the office today to discussed weight loss. Pt started losing weight in 2016 and lost a total of 61 lbs. Pt states since restarting phentermine 37.5 mg in 06/17/15, but has lost 4 lbs. PT states she is trying to exercise, but that is harder while she is working and enrolled in school. Pt would like to continue Phentermine today.    Review of Systems  Constitutional: Negative.   HENT: Negative.   Eyes: Negative.   Respiratory: Negative.  Negative for shortness of breath.   Cardiovascular: Negative.  Negative for palpitations.  Gastrointestinal: Negative.   Endocrine: Negative.   Genitourinary: Negative.   Musculoskeletal: Negative.   Neurological: Negative.  Negative for headaches.  Hematological: Negative.   Psychiatric/Behavioral: Negative.   All other systems reviewed and are negative.      Objective:   Physical Exam  Constitutional: She is oriented to person, place, and time. She appears well-developed and well-nourished. No distress.  Obese   HENT:  Head: Normocephalic and atraumatic.  Right Ear: External ear normal.  Left Ear: External ear normal.  Nose: Nose normal.  Mouth/Throat: Oropharynx is clear and moist.  Eyes: Pupils are equal, round, and reactive to light.  Neck: Normal range of motion. Neck supple. No thyromegaly present.  Cardiovascular: Normal rate, regular rhythm, normal heart sounds and intact distal pulses.   No murmur heard. Pulmonary/Chest: Effort normal and breath sounds normal. No respiratory distress. She has no wheezes.  Abdominal: Soft. Bowel sounds are normal. She exhibits no distension. There is no tenderness.  Musculoskeletal: Normal range of motion. She exhibits no edema or tenderness.  Neurological: She is alert and oriented to person, place, and time. She has normal reflexes. No cranial nerve deficit.  Skin: Skin is warm  and dry.  Psychiatric: She has a normal mood and affect. Her behavior is normal. Judgment and thought content normal.  Vitals reviewed.     BP 125/71   Pulse 88   Temp 98 F (36.7 C) (Oral)   Ht '5\' 3"'$  (1.6 m)   Wt 234 lb 3.2 oz (106.2 kg)   BMI 41.49 kg/m      Assessment & Plan:  1. Encounter for weight loss counseling - phentermine 37.5 MG capsule; Take 1 capsule (37.5 mg total) by mouth every morning.  Dispense: 90 capsule; Refill: 0 - BMP8+EGFR  2. Obesity - phentermine 37.5 MG capsule; Take 1 capsule (37.5 mg total) by mouth every morning.  Dispense: 90 capsule; Refill: 0 - BMP8+EGFR   Continue all meds Labs pending Health Maintenance reviewed Diet and exercise encouraged RTO 3 months  Evelina Dun, FNP

## 2015-12-23 ENCOUNTER — Ambulatory Visit: Payer: BLUE CROSS/BLUE SHIELD | Admitting: Family

## 2016-01-29 ENCOUNTER — Encounter: Payer: Self-pay | Admitting: Family

## 2016-01-29 ENCOUNTER — Ambulatory Visit (INDEPENDENT_AMBULATORY_CARE_PROVIDER_SITE_OTHER): Payer: BLUE CROSS/BLUE SHIELD | Admitting: Family

## 2016-01-29 VITALS — BP 123/81 | HR 70 | Temp 97.5°F | Ht 63.0 in | Wt 238.6 lb

## 2016-01-29 DIAGNOSIS — Z713 Dietary counseling and surveillance: Secondary | ICD-10-CM

## 2016-01-29 DIAGNOSIS — Z6841 Body Mass Index (BMI) 40.0 and over, adult: Secondary | ICD-10-CM

## 2016-01-29 DIAGNOSIS — E669 Obesity, unspecified: Secondary | ICD-10-CM

## 2016-01-29 DIAGNOSIS — IMO0001 Reserved for inherently not codable concepts without codable children: Secondary | ICD-10-CM

## 2016-01-29 MED ORDER — PHENTERMINE HCL 37.5 MG PO CAPS: 37.5000 mg | ORAL_CAPSULE | ORAL | 0 refills | Status: DC

## 2016-01-29 NOTE — Progress Notes (Signed)
   Subjective:    Patient ID: Paula Wells, female    DOB: [birth year], 25 y.o.   MRN: [id number]  HPI Pt presents to the office today to discussed weight loss. Pt started losing weight in 2016 and lost a total of 61 lbs. PT restarted the phentermine in 09/18/2015 and since then she has gained 4 lb. PT states she has just now restarted her exercising. PT states she has "been off track", but in the last few weeks as restarted.   Review of Systems  Constitutional: Negative.   HENT: Negative.   Eyes: Negative.   Respiratory: Negative.  Negative for shortness of breath.   Cardiovascular: Negative.  Negative for palpitations.  Gastrointestinal: Negative.   Endocrine: Negative.   Genitourinary: Negative.   Musculoskeletal: Negative.   Neurological: Negative.  Negative for headaches.  Hematological: Negative.   Psychiatric/Behavioral: Negative.   All other systems reviewed and are negative.      Objective:   Physical Exam  Constitutional: She is oriented to person, place, and time. She appears well-developed and well-nourished. No distress.  Obese   HENT:  Head: Normocephalic and atraumatic.  Right Ear: External ear normal.  Left Ear: External ear normal.  Nose: Nose normal.  Mouth/Throat: Oropharynx is clear and moist.  Eyes: Pupils are equal, round, and reactive to light.  Neck: Normal range of motion. Neck supple. No thyromegaly present.  Cardiovascular: Normal rate, regular rhythm, normal heart sounds and intact distal pulses.   No murmur heard. Pulmonary/Chest: Effort normal and breath sounds normal. No respiratory distress. She has no wheezes.  Abdominal: Soft. Bowel sounds are normal. She exhibits no distension. There is no tenderness.  Musculoskeletal: Normal range of motion. She exhibits no edema or tenderness.  Neurological: She is alert and oriented to person, place, and time. She has normal reflexes. No cranial nerve deficit.  Skin: Skin is warm and dry.    Psychiatric: She has a normal mood and affect. Her behavior is normal. Judgment and thought content normal.  Vitals reviewed.     BP 123/81   Pulse 70   Temp 97.5 F (36.4 C) (Oral)   Ht '5\' 3"'$  (1.6 m)   Wt 238 lb 9.6 oz (108.2 kg)   BMI 42.27 kg/m      Assessment & Plan:  1. Encounter for weight loss counseling - CMP14+EGFR - phentermine 37.5 MG capsule; Take 1 capsule (37.5 mg total) by mouth every morning.  Dispense: 90 capsule; Refill: 0  2. Class 3 obesity without serious comorbidity with body mass index (BMI) of 40.0 to 44.9 in adult, unspecified obesity type (HCC) - CMP14+EGFR - phentermine 37.5 MG capsule; Take 1 capsule (37.5 mg total) by mouth every morning.  Dispense: 90 capsule; Refill: 0  Long discussion with patient about exercise and dieting We will reorder phentermine today, but is she does not lose at least 5% of weight lose we will stop medication.  RTO in 3 months  Evelina Dun, FNP

## 2016-01-29 NOTE — Patient Instructions (Signed)

## 2016-01-30 LAB — CMP14+EGFR
A/G RATIO: 2 (ref 1.2–2.2)
ALT: 10 IU/L (ref 0–32)
AST: 17 IU/L (ref 0–40)
Albumin: 4.4 g/dL (ref 3.5–5.5)
Alkaline Phosphatase: 55 IU/L (ref 39–117)
BILIRUBIN TOTAL: 0.3 mg/dL (ref 0.0–1.2)
BUN/Creatinine Ratio: 20 (ref 9–23)
BUN: 12 mg/dL (ref 6–20)
CHLORIDE: 99 mmol/L (ref 96–106)
CO2: 25 mmol/L (ref 18–29)
Calcium: 9.5 mg/dL (ref 8.7–10.2)
Creatinine, Ser: 0.59 mg/dL (ref 0.57–1.00)
GFR calc Af Amer: 148 mL/min/{1.73_m2} (ref >59)
GFR calc non Af Amer: 129 mL/min/{1.73_m2} (ref >59)
Globulin, Total: 2.2 g/dL (ref 1.5–4.5)
Glucose: 106 mg/dL — ABNORMAL HIGH (ref 65–99)
POTASSIUM: 4.5 mmol/L (ref 3.5–5.2)
Sodium: 139 mmol/L (ref 134–144)
Total Protein: 6.6 g/dL (ref 6.0–8.5)

## 2016-04-29 ENCOUNTER — Ambulatory Visit: Payer: BLUE CROSS/BLUE SHIELD | Admitting: Family

## 2016-05-04 ENCOUNTER — Encounter: Payer: Self-pay | Admitting: Family

## 2016-05-04 ENCOUNTER — Ambulatory Visit (INDEPENDENT_AMBULATORY_CARE_PROVIDER_SITE_OTHER): Payer: BLUE CROSS/BLUE SHIELD | Admitting: Family

## 2016-05-04 VITALS — BP 112/80 | HR 69 | Temp 97.5°F | Ht 63.0 in | Wt 228.0 lb

## 2016-05-04 DIAGNOSIS — Z6841 Body Mass Index (BMI) 40.0 and over, adult: Secondary | ICD-10-CM

## 2016-05-04 DIAGNOSIS — IMO0001 Reserved for inherently not codable concepts without codable children: Secondary | ICD-10-CM

## 2016-05-04 DIAGNOSIS — Z713 Dietary counseling and surveillance: Secondary | ICD-10-CM | POA: Diagnosis not present

## 2016-05-04 DIAGNOSIS — F419 Anxiety disorder, unspecified: Secondary | ICD-10-CM

## 2016-05-04 DIAGNOSIS — F32A Depression, unspecified: Secondary | ICD-10-CM

## 2016-05-04 DIAGNOSIS — E669 Obesity, unspecified: Secondary | ICD-10-CM

## 2016-05-04 DIAGNOSIS — F418 Other specified anxiety disorders: Secondary | ICD-10-CM

## 2016-05-04 DIAGNOSIS — F329 Major depressive disorder, single episode, unspecified: Secondary | ICD-10-CM

## 2016-05-04 MED ORDER — ESCITALOPRAM OXALATE 10 MG PO TABS: 10.0000 mg | ORAL_TABLET | Freq: Every day | ORAL | 3 refills | Status: DC

## 2016-05-04 MED ORDER — PHENTERMINE HCL 37.5 MG PO CAPS: 37.5000 mg | ORAL_CAPSULE | ORAL | 0 refills | Status: DC

## 2016-05-04 NOTE — Addendum Note (Signed)
Addended by: Evelina Dun A on: 05/04/2016 04:41 PM   Modules accepted: Orders

## 2016-05-04 NOTE — Progress Notes (Addendum)
Subjective:    Patient ID: Paula Wells, female    DOB: 1990-08-06, 26 y.o.   MRN: [id number]  Pt presents to the office today to discussed weight loss. Pt started losing weight in 2016 and lost a total of 61 lbs. PT restarted the phentermine in 09/18/2015 and  then  gained 4 lb. PT was told she had to lose 5% of weight to continue medications. PT states she has tried to exercise and be on a strict diet. PT states she has lost 10 lbs since 01/29/16.  Depression       The patient presents with depression.  The current episode started more than 1 year ago.   The problem occurs intermittently.  Associated symptoms include irritable and sad.  Associated symptoms include no helplessness, no hopelessness and no headaches.  Past treatments include nothing.  Past medical history includes depression.     Review of Systems  Constitutional: Negative.   HENT: Negative.   Eyes: Negative.   Respiratory: Negative.   Cardiovascular: Negative.   Gastrointestinal: Negative.   Endocrine: Negative.   Genitourinary: Negative.   Musculoskeletal: Negative.   Neurological: Negative.  Negative for headaches.  Hematological: Negative.   Psychiatric/Behavioral: Positive for depression.  All other systems reviewed and are negative.      Objective:   Physical Exam  Constitutional: She is oriented to person, place, and time. She appears well-developed and well-nourished. She is irritable. No distress.  Obese   HENT:  Head: Normocephalic and atraumatic.  Right Ear: External ear normal.  Left Ear: External ear normal.  Nose: Nose normal.  Mouth/Throat: Oropharynx is clear and moist.  Eyes: Pupils are equal, round, and reactive to light.  Neck: Normal range of motion. Neck supple. No thyromegaly present.  Cardiovascular: Normal rate, regular rhythm, normal heart sounds and intact distal pulses.   No murmur heard. Pulmonary/Chest: Effort normal and breath sounds normal. No respiratory distress. She has  no wheezes.  Abdominal: Soft. Bowel sounds are normal. She exhibits no distension. There is no tenderness.  Musculoskeletal: Normal range of motion. She exhibits no edema or tenderness.  Neurological: She is alert and oriented to person, place, and time.  Skin: Skin is warm and dry.  Psychiatric: She has a normal mood and affect. Her behavior is normal. Judgment and thought content normal.  Vitals reviewed.     BP 112/80   Pulse 69   Temp 97.5 F (36.4 C) (Oral)   Ht _0  (1.6 m)   Wt 228 lb (103.4 kg)   BMI 40.39 kg/m      Assessment & Plan:  1. Encounter for weight loss counseling - CMP14+EGFR - phentermine 37.5 MG capsule; Take 1 capsule (37.5 mg total) by mouth every morning.  Dispense: 90 capsule; Refill: 0  2. Class 3 obesity without serious comorbidity with body mass index (BMI) of 40.0 to 44.9 in adult, unspecified obesity type (HCC) - CMP14+EGFR - phentermine 37.5 MG capsule; Take 1 capsule (37.5 mg total) by mouth every morning.  Dispense: 90 capsule; Refill: 0  3. Anxiety and depression -Pt started on lexapro 10 mg Stress management discussed RTO in 6 weeks - escitalopram (LEXAPRO) 10 MG tablet; Take 1 tablet (10 mg total) by mouth daily.  Dispense: 90 tablet; Refill: 3   Long discussion with patient about exercise and dieting We will reorder phentermine today, but is she does not lose at least 5% of weight lose we will stop medication.  RTO in 6 weeks  [employer]  Lenna Gilford, [place]

## 2016-05-04 NOTE — Patient Instructions (Signed)

## 2016-05-05 LAB — CMP14+EGFR
ALT: 11 IU/L (ref 0–32)
AST: 17 IU/L (ref 0–40)
Albumin/Globulin Ratio: 2 (ref 1.2–2.2)
Albumin: 4.5 g/dL (ref 3.5–5.5)
Alkaline Phosphatase: 45 IU/L (ref 39–117)
BUN/Creatinine Ratio: 24 — ABNORMAL HIGH (ref 9–23)
BUN: 14 mg/dL (ref 6–20)
Bilirubin Total: 0.2 mg/dL (ref 0.0–1.2)
CALCIUM: 9.2 mg/dL (ref 8.7–10.2)
CO2: 26 mmol/L (ref 18–29)
CREATININE: 0.58 mg/dL (ref 0.57–1.00)
Chloride: 99 mmol/L (ref 96–106)
GFR calc Af Amer: 148 mL/min/{1.73_m2} (ref >59)
GFR calc non Af Amer: 129 mL/min/{1.73_m2} (ref >59)
Globulin, Total: 2.2 g/dL (ref 1.5–4.5)
Glucose: 95 mg/dL (ref 65–99)
Potassium: 4.1 mmol/L (ref 3.5–5.2)
Sodium: 139 mmol/L (ref 134–144)
Total Protein: 6.7 g/dL (ref 6.0–8.5)

## 2016-05-10 ENCOUNTER — Encounter: Payer: Self-pay | Admitting: *Deleted

## 2016-07-02 ENCOUNTER — Other Ambulatory Visit: Payer: Self-pay | Admitting: Family

## 2016-07-06 ENCOUNTER — Other Ambulatory Visit: Payer: Self-pay

## 2016-07-06 MED ORDER — VITAMIN D (ERGOCALCIFEROL) 1.25 MG (50000 UNIT) PO CAPS: 50000.0000 [IU] | ORAL_CAPSULE | ORAL | 3 refills | Status: DC

## 2016-08-03 ENCOUNTER — Ambulatory Visit: Payer: BLUE CROSS/BLUE SHIELD | Admitting: Family

## 2016-11-04 ENCOUNTER — Encounter: Payer: Self-pay | Admitting: Family

## 2016-11-04 ENCOUNTER — Ambulatory Visit (INDEPENDENT_AMBULATORY_CARE_PROVIDER_SITE_OTHER): Payer: BLUE CROSS/BLUE SHIELD | Admitting: Family

## 2016-11-04 VITALS — BP 102/70 | HR 63 | Temp 98.9°F | Ht 63.0 in | Wt 253.0 lb

## 2016-11-04 DIAGNOSIS — Z713 Dietary counseling and surveillance: Secondary | ICD-10-CM | POA: Diagnosis not present

## 2016-11-04 DIAGNOSIS — F411 Generalized anxiety disorder: Secondary | ICD-10-CM | POA: Diagnosis not present

## 2016-11-04 DIAGNOSIS — F329 Major depressive disorder, single episode, unspecified: Secondary | ICD-10-CM | POA: Insufficient documentation

## 2016-11-04 DIAGNOSIS — Z23 Encounter for immunization: Secondary | ICD-10-CM

## 2016-11-04 DIAGNOSIS — F32A Depression, unspecified: Secondary | ICD-10-CM | POA: Insufficient documentation

## 2016-11-04 DIAGNOSIS — F331 Major depressive disorder, recurrent, moderate: Secondary | ICD-10-CM

## 2016-11-04 MED ORDER — PHENTERMINE HCL 37.5 MG PO CAPS: 37.5000 mg | ORAL_CAPSULE | ORAL | 0 refills | Status: DC

## 2016-11-04 NOTE — Patient Instructions (Signed)
Exercising to Lose Weight Exercising can help you to lose weight. In order to lose weight through exercise, you need to do vigorous-intensity exercise. You can tell that you are exercising with vigorous intensity if you are breathing very hard and fast and cannot hold a conversation while exercising. Moderate-intensity exercise helps to maintain your current weight. You can tell that you are exercising at a moderate level if you have a higher heart rate and faster breathing, but you are still able to hold a conversation. How often should I exercise? Choose an activity that you enjoy and set realistic goals. Your health care provider can help you to make an activity plan that works for you. Exercise regularly as directed by your health care provider. This may include:  Doing resistance training twice each week, such as: ? Push-ups. ? Sit-ups. ? Lifting weights. ? Using resistance bands.  Doing a given intensity of exercise for a given amount of time. Choose from these options: ? 150 minutes of moderate-intensity exercise every week. ? 75 minutes of vigorous-intensity exercise every week. ? A mix of moderate-intensity and vigorous-intensity exercise every week.  Children, pregnant women, people who are out of shape, people who are overweight, and older adults may need to consult a health care provider for individual recommendations. If you have any sort of medical condition, be sure to consult your health care provider before starting a new exercise program. What are some activities that can help me to lose weight?  Walking at a rate of at least 4.5 miles an hour.  Jogging or running at a rate of 5 miles per hour.  Biking at a rate of at least 10 miles per hour.  Lap swimming.  Roller-skating or in-line skating.  Cross-country skiing.  Vigorous competitive sports, such as football, basketball, and soccer.  Jumping rope.  Aerobic dancing. How can I be more active in my day-to-day  activities?  Use the stairs instead of the elevator.  Take a walk during your lunch break.  If you drive, park your car farther away from work or school.  If you take public transportation, get off one stop early and walk the rest of the way.  Make all of your phone calls while standing up and walking around.  Get up, stretch, and walk around every 30 minutes throughout the day. What guidelines should I follow while exercising?  Do not exercise so much that you hurt yourself, feel dizzy, or get very short of breath.  Consult your health care provider prior to starting a new exercise program.  Wear comfortable clothes and shoes with good support.  Drink plenty of water while you exercise to prevent dehydration or heat stroke. Body water is lost during exercise and must be replaced.  Work out until you breathe faster and your heart beats faster. This information is not intended to replace advice given to you by your health care provider. Make sure you discuss any questions you have with your health care provider. Document Released: 02/20/2010 Document Revised: 06/26/2015 Document Reviewed: 06/21/2013 Elsevier Interactive Patient Education  2018 [employer] Inc[employer]  

## 2016-11-04 NOTE — Progress Notes (Signed)
   Subjective:    Patient ID: Daine Gip, female    DOB: April 28, 1990, 26 y.o.   MRN: [id number]  HPI Pt presents to the office today for weight loss management. PT has not been on phentermine in over 2 months. PT reports that she has not been exercising.   PT has gain 15 lbs since our last visit. PT states she believes she has gained this since stopping the phentermine.   Pt denies any headache, palpitations, SOB, or edema at this time.   PT continues to take the Lexapro 10 mg. Denies any sad, depression, or anxious. She is happy with her current dose.    Review of Systems  All other systems reviewed and are negative.      Objective:   Physical Exam  Constitutional: She is oriented to person, place, and time. She appears well-developed and well-nourished. No distress.  Morbid obesity  HENT:  Head: Normocephalic and atraumatic.  Right Ear: External ear normal.  Left Ear: External ear normal.  Nose: Nose normal.  Mouth/Throat: Oropharynx is clear and moist.  Eyes: Pupils are equal, round, and reactive to light.  Neck: Normal range of motion. Neck supple. No thyromegaly present.  Cardiovascular: Normal rate, regular rhythm, normal heart sounds and intact distal pulses.   No murmur heard. Pulmonary/Chest: Effort normal and breath sounds normal. No respiratory distress. She has no wheezes.  Abdominal: Soft. Bowel sounds are normal. She exhibits no distension. There is no tenderness.  Musculoskeletal: Normal range of motion. She exhibits no edema or tenderness.  Neurological: She is alert and oriented to person, place, and time.  Skin: Skin is warm and dry.  Psychiatric: She has a normal mood and affect. Her behavior is normal. Judgment and thought content normal.  Vitals reviewed.     BP 102/70   Pulse 63   Temp 98.9 F (37.2 C) (Oral)   Ht '5\' 3"'$  (1.6 m)   Wt 253 lb (114.8 kg)   BMI 44.82 kg/m      Assessment & Plan:  1. GAD (generalized anxiety  disorder) Continue Lexapro Stress management discussed - CMP14+EGFR  2. Moderate episode of recurrent major depressive disorder (HCC) - CMP14+EGFR  3. Morbid obesity ([place]) - CMP14+EGFR  4. Encounter for weight loss counseling Encourage diet and exercising  Discussed she needs to lose 5% of her weight loss to continue medicaiton - CMP14+EGFR - phentermine 37.5 MG capsule; Take 1 capsule (37.5 mg total) by mouth every morning.  Dispense: 90 capsule; Refill: 0   Evelina Dun, FNP

## 2016-11-05 LAB — CMP14+EGFR
ALBUMIN: 4.6 g/dL (ref 3.5–5.5)
ALT: 14 IU/L (ref 0–32)
AST: 19 IU/L (ref 0–40)
Albumin/Globulin Ratio: 1.8 (ref 1.2–2.2)
Alkaline Phosphatase: 50 IU/L (ref 39–117)
BUN / CREAT RATIO: 27 — AB (ref 9–23)
BUN: 17 mg/dL (ref 6–20)
Bilirubin Total: 0.3 mg/dL (ref 0.0–1.2)
CALCIUM: 9.7 mg/dL (ref 8.7–10.2)
CO2: 22 mmol/L (ref 20–29)
CREATININE: 0.62 mg/dL (ref 0.57–1.00)
Chloride: 100 mmol/L (ref 96–106)
GFR, EST AFRICAN AMERICAN: 145 mL/min/{1.73_m2} (ref >59)
GFR, EST NON AFRICAN AMERICAN: 126 mL/min/{1.73_m2} (ref >59)
GLOBULIN, TOTAL: 2.5 g/dL (ref 1.5–4.5)
Glucose: 101 mg/dL — ABNORMAL HIGH (ref 65–99)
Potassium: 4.6 mmol/L (ref 3.5–5.2)
SODIUM: 137 mmol/L (ref 134–144)
TOTAL PROTEIN: 7.1 g/dL (ref 6.0–8.5)

## 2017-02-08 ENCOUNTER — Ambulatory Visit: Payer: BLUE CROSS/BLUE SHIELD | Admitting: Family

## 2018-01-26 ENCOUNTER — Ambulatory Visit (INDEPENDENT_AMBULATORY_CARE_PROVIDER_SITE_OTHER): Payer: Medicaid Other | Admitting: Family

## 2018-01-26 ENCOUNTER — Encounter: Payer: Self-pay | Admitting: Family

## 2018-01-26 VITALS — BP 123/77 | HR 69 | Temp 97.5°F | Ht 63.0 in | Wt 320.0 lb

## 2018-01-26 DIAGNOSIS — Z23 Encounter for immunization: Secondary | ICD-10-CM

## 2018-01-26 DIAGNOSIS — Z0000 Encounter for general adult medical examination without abnormal findings: Secondary | ICD-10-CM

## 2018-01-26 DIAGNOSIS — Z01419 Encounter for gynecological examination (general) (routine) without abnormal findings: Secondary | ICD-10-CM | POA: Diagnosis not present

## 2018-01-26 DIAGNOSIS — Z713 Dietary counseling and surveillance: Secondary | ICD-10-CM

## 2018-01-26 DIAGNOSIS — F331 Major depressive disorder, recurrent, moderate: Secondary | ICD-10-CM

## 2018-01-26 DIAGNOSIS — F411 Generalized anxiety disorder: Secondary | ICD-10-CM

## 2018-01-26 MED ORDER — PHENTERMINE HCL 37.5 MG PO CAPS: 37.5000 mg | ORAL_CAPSULE | ORAL | 3 refills | Status: DC

## 2018-01-26 MED ORDER — ESCITALOPRAM OXALATE 20 MG PO TABS: 20.0000 mg | ORAL_TABLET | Freq: Every day | ORAL | 4 refills | Status: DC

## 2018-01-26 NOTE — Progress Notes (Signed)
Subjective:    Patient ID: Paula Wells, female    DOB: 1990-10-22, 27 y.o.   MRN: [id number]  Chief Complaint  Patient presents with  . Gynecologic Exam   Pt presents to the office today for CPE with pap. PT requesting to restart phentermine today. States she has been off of it for over a year and half now. She reports she is going to start dieting with the new year.                                         Anxiety  Presents for follow-up visit. Symptoms include depressed mood, excessive worry, nervous/anxious behavior and restlessness. Symptoms occur most days. The severity of symptoms is moderate.    Depression         This is a chronic problem.  The current episode started more than 1 year ago.   The onset quality is gradual.   The problem occurs intermittently.  Associated symptoms include irritable, restlessness and sad.  Associated symptoms include no helplessness and no hopelessness.  Past treatments include SSRIs - Selective serotonin reuptake inhibitors.  Past medical history includes anxiety.       Review of Systems  Psychiatric/Behavioral: Positive for depression. The patient is nervous/anxious.   All other systems reviewed and are negative.      Objective:   Physical Exam Vitals signs reviewed.  Constitutional:      General: She is irritable. She is not in acute distress.    Appearance: She is well-developed.  HENT:     Head: Normocephalic and atraumatic.  Eyes:     Pupils: Pupils are equal, round, and reactive to light.  Neck:     Musculoskeletal: Normal range of motion and neck supple.     Thyroid: No thyromegaly.  Cardiovascular:     Rate and Rhythm: Normal rate and regular rhythm.     Heart sounds: Normal heart sounds. No murmur.  Pulmonary:     Effort: Pulmonary effort is normal. No respiratory distress.     Breath sounds: Normal breath sounds. No wheezing.  Chest:     Breasts:        Right: No swelling, bleeding, inverted nipple, mass, nipple  discharge, skin change or tenderness.        Left: No swelling, bleeding, inverted nipple, mass, nipple discharge, skin change or tenderness.  Abdominal:     General: Bowel sounds are normal. There is no distension.     Palpations: Abdomen is soft.     Tenderness: There is no abdominal tenderness.  Genitourinary:    Labia:        Right: No rash, tenderness, lesion or injury.        Left: No rash, tenderness, lesion or injury.      Comments: Bimanual exam- no adnexal masses or tenderness, ovaries nonpalpable   Cervix parous and pink- No discharge  Musculoskeletal: Normal range of motion.        General: No tenderness.  Skin:    General: Skin is warm and dry.  Neurological:     Mental Status: She is alert and oriented to person, place, and time.     Cranial Nerves: No cranial nerve deficit.     Deep Tendon Reflexes: Reflexes are normal and symmetric.  Psychiatric:        Behavior: Behavior normal.        Thought  Content: Thought content normal.        Judgment: Judgment normal.       BP 123/77   Pulse 69   Temp (!) 97.5 F (36.4 C) (Oral)   Ht 5' 3"  (1.6 m)   Wt (!) 320 lb (145.2 kg)   BMI 56.69 kg/m      Assessment & Plan:  Rivka Baune comes in today with chief complaint of Gynecologic Exam   Diagnosis and orders addressed:  1. Annual physical exam - CMP14+EGFR - CBC with Differential/Platelet - Lipid panel - TSH  2. GAD (generalized anxiety disorder) We will increase Lexapro to 20 mg from 74m Stress management discussed - CMP14+EGFR - CBC with Differential/Platelet - escitalopram (LEXAPRO) 20 MG tablet; Take 1 tablet (20 mg total) by mouth daily.  Dispense: 90 tablet; Refill: 4  3. Moderate episode of recurrent major depressive disorder (HCC) - CMP14+EGFR - CBC with Differential/Platelet - escitalopram (LEXAPRO) 20 MG tablet; Take 1 tablet (20 mg total) by mouth daily.  Dispense: 90 tablet; Refill: 4  4. Morbid obesity ([place] - CMP14+EGFR - CBC  with Differential/Platelet  5. Encounter for gynecological examination without abnormal finding - IGP, rfx Aptima HPV ASCU  6. Encounter for weight loss counseling Follow up in 3 months Must lose 5% of weight to continue  - phentermine 37.5 MG capsule; Take 1 capsule (37.5 mg total) by mouth every morning.  Dispense: 30 capsule; Refill: 3   Labs pending Health Maintenance reviewed Diet and exercise encouraged  Follow up plan: 3 months for weight loss   CEvelina Dun FNP

## 2018-01-26 NOTE — Patient Instructions (Signed)

## 2018-01-27 LAB — CMP14+EGFR
A/G RATIO: 1.7 (ref 1.2–2.2)
ALK PHOS: 87 IU/L (ref 39–117)
ALT: 22 IU/L (ref 0–32)
AST: 19 IU/L (ref 0–40)
Albumin: 4.2 g/dL (ref 3.5–5.5)
BILIRUBIN TOTAL: 0.3 mg/dL (ref 0.0–1.2)
BUN/Creatinine Ratio: 18 (ref 9–23)
BUN: 13 mg/dL (ref 6–20)
CHLORIDE: 99 mmol/L (ref 96–106)
CO2: 23 mmol/L (ref 20–29)
Calcium: 9.7 mg/dL (ref 8.7–10.2)
Creatinine, Ser: 0.74 mg/dL (ref 0.57–1.00)
GFR calc non Af Amer: 112 mL/min/{1.73_m2} (ref >59)
GFR, EST AFRICAN AMERICAN: 129 mL/min/{1.73_m2} (ref >59)
GLUCOSE: 73 mg/dL (ref 65–99)
Globulin, Total: 2.5 g/dL (ref 1.5–4.5)
POTASSIUM: 4.3 mmol/L (ref 3.5–5.2)
Sodium: 139 mmol/L (ref 134–144)
Total Protein: 6.7 g/dL (ref 6.0–8.5)

## 2018-01-27 LAB — CBC WITH DIFFERENTIAL/PLATELET
BASOS ABS: 0.1 10*3/uL (ref 0.0–0.2)
Basos: 1 %
EOS (ABSOLUTE): 0.2 10*3/uL (ref 0.0–0.4)
Eos: 2 %
HEMOGLOBIN: 12.9 g/dL (ref 11.1–15.9)
Hematocrit: 39.4 % (ref 34.0–46.6)
IMMATURE GRANS (ABS): 0 10*3/uL (ref 0.0–0.1)
Immature Granulocytes: 0 %
LYMPHS: 30 %
Lymphocytes Absolute: 2.9 10*3/uL (ref 0.7–3.1)
MCH: 29 pg (ref 26.6–33.0)
MCHC: 32.7 g/dL (ref 31.5–35.7)
MCV: 89 fL (ref 79–97)
MONOCYTES: 10 %
Monocytes Absolute: 1 10*3/uL — ABNORMAL HIGH (ref 0.1–0.9)
NEUTROS ABS: 5.6 10*3/uL (ref 1.4–7.0)
Neutrophils: 57 %
PLATELETS: 542 10*3/uL — AB (ref 150–450)
RBC: 4.45 x10E6/uL (ref 3.77–5.28)
RDW: 12.6 % (ref 12.3–15.4)
WBC: 9.8 10*3/uL (ref 3.4–10.8)

## 2018-01-27 LAB — LIPID PANEL
Chol/HDL Ratio: 2.2 ratio (ref 0.0–4.4)
Cholesterol, Total: 233 mg/dL — ABNORMAL HIGH (ref 100–199)
HDL: 108 mg/dL (ref >39)
LDL CALC: 113 mg/dL — AB (ref 0–99)
TRIGLYCERIDES: 59 mg/dL (ref 0–149)
VLDL CHOLESTEROL CAL: 12 mg/dL (ref 5–40)

## 2018-01-27 LAB — TSH: TSH: 1.29 u[IU]/mL (ref 0.450–4.500)

## 2018-01-30 LAB — IGP, RFX APTIMA HPV ASCU

## 2018-10-21 ENCOUNTER — Other Ambulatory Visit: Payer: Self-pay | Admitting: Family

## 2018-10-21 DIAGNOSIS — Z713 Dietary counseling and surveillance: Secondary | ICD-10-CM

## 2019-03-02 ENCOUNTER — Ambulatory Visit: Payer: Medicaid Other | Admitting: Family

## 2019-05-11 ENCOUNTER — Encounter: Payer: Self-pay | Admitting: Family

## 2019-05-11 ENCOUNTER — Other Ambulatory Visit: Payer: Self-pay

## 2019-05-11 ENCOUNTER — Ambulatory Visit (INDEPENDENT_AMBULATORY_CARE_PROVIDER_SITE_OTHER): Payer: Medicaid Other | Admitting: Family

## 2019-05-11 VITALS — BP 133/92 | HR 92 | Temp 98.0°F | Ht 63.0 in | Wt 370.0 lb

## 2019-05-11 DIAGNOSIS — Z01419 Encounter for gynecological examination (general) (routine) without abnormal findings: Secondary | ICD-10-CM

## 2019-05-11 DIAGNOSIS — Z0000 Encounter for general adult medical examination without abnormal findings: Secondary | ICD-10-CM

## 2019-05-11 DIAGNOSIS — Z5320 Procedure and treatment not carried out because of patient's decision for unspecified reasons: Secondary | ICD-10-CM

## 2019-05-11 DIAGNOSIS — Z9189 Other specified personal risk factors, not elsewhere classified: Secondary | ICD-10-CM

## 2019-05-11 DIAGNOSIS — F411 Generalized anxiety disorder: Secondary | ICD-10-CM

## 2019-05-11 DIAGNOSIS — F331 Major depressive disorder, recurrent, moderate: Secondary | ICD-10-CM

## 2019-05-11 DIAGNOSIS — Z713 Dietary counseling and surveillance: Secondary | ICD-10-CM

## 2019-05-11 DIAGNOSIS — Z114 Encounter for screening for human immunodeficiency virus [HIV]: Secondary | ICD-10-CM

## 2019-05-11 MED ORDER — ESCITALOPRAM OXALATE 20 MG PO TABS: 20.0000 mg | ORAL_TABLET | Freq: Every day | ORAL | 4 refills | Status: DC

## 2019-05-11 MED ORDER — PHENTERMINE HCL 37.5 MG PO CAPS: ORAL_CAPSULE | ORAL | 2 refills | Status: DC

## 2019-05-11 NOTE — Progress Notes (Signed)
Subjective:    Patient ID: Paula Wells, female    DOB: 02-17-1990, 29 y.o.   MRN: [id number]  Chief Complaint  Patient presents with  . Annual Exam    with pap   Pt presents to the office today for CPE with pap. She is requesting a refill of the phentermine. She reports she has not had it within the last two years, but has gained 50 lbs over the last year.  Anxiety Presents for follow-up visit. Symptoms include depressed mood, excessive worry, irritability, nervous/anxious behavior and restlessness. Patient reports no suicidal ideas. Symptoms occur occasionally. The severity of symptoms is moderate. The quality of sleep is good.    Depression        This is a chronic problem.  The current episode started more than 1 year ago.   The onset quality is gradual.   The problem has been waxing and waning since onset.  Associated symptoms include irritable, restlessness and sad.  Associated symptoms include no helplessness, no hopelessness and no suicidal ideas.  Past treatments include SSRIs - Selective serotonin reuptake inhibitors.  Past medical history includes anxiety.       Review of Systems  Constitutional: Positive for irritability.  Psychiatric/Behavioral: Positive for depression. Negative for suicidal ideas. The patient is nervous/anxious.   All other systems reviewed and are negative.  Family History  Problem Relation Age of Onset  . Diabetes Mother   . Birth defects Brother    Social History   Socioeconomic History  . Marital status: Single    Spouse name: Not on file  . Number of children: Not on file  . Years of education: Not on file  . Highest education level: Not on file  Occupational History  . Not on file  Tobacco Use  . Smoking status: Never Smoker  . Smokeless tobacco: Never Used  Substance and Sexual Activity  . Alcohol use: No    Alcohol/week: 0.0 standard drinks  . Drug use: No  . Sexual activity: Never  Other Topics Concern  . Not on file    Social History Narrative  . Not on file   Social Determinants of Health   Financial Resource Strain:   . Difficulty of Paying Living Expenses:   Food Insecurity:   . Worried About Charity fundraiser in the Last Year:   . Arboriculturist in the Last Year:   Transportation Needs:   . Film/video editor (Medical):   Marland Kitchen Lack of Transportation (Non-Medical):   Physical Activity:   . Days of Exercise per Week:   . Minutes of Exercise per Session:   Stress:   . Feeling of Stress :   Social Connections:   . Frequency of Communication with Friends and Family:   . Frequency of Social Gatherings with Friends and Family:   . Attends Religious Services:   . Active Member of Clubs or Organizations:   . Attends Archivist Meetings:   Marland Kitchen Marital Status:         Objective:   Physical Exam Vitals reviewed.  Constitutional:      General: She is irritable. She is not in acute distress.    Appearance: She is well-developed. She is obese.  HENT:     Head: Normocephalic and atraumatic.     Right Ear: External ear normal.  Eyes:     Pupils: Pupils are equal, round, and reactive to light.  Neck:     Thyroid: No thyromegaly.  Cardiovascular:     Rate and Rhythm: Normal rate and regular rhythm.     Heart sounds: Normal heart sounds. No murmur.  Pulmonary:     Effort: Pulmonary effort is normal. No respiratory distress.     Breath sounds: Normal breath sounds. No wheezing.  Chest:     Breasts:        Right: No swelling, bleeding, inverted nipple, mass, nipple discharge, skin change or tenderness.        Left: No swelling, bleeding, inverted nipple, mass, nipple discharge, skin change or tenderness.  Abdominal:     General: Bowel sounds are normal. There is no distension.     Palpations: Abdomen is soft.     Tenderness: There is no abdominal tenderness.  Genitourinary:    Comments: Bimanual exam- no adnexal masses or tenderness, ovaries nonpalpable   Hard to visualize  cervix due to morbid obesity and pain with spectrum   Musculoskeletal:        General: No tenderness. Normal range of motion.     Cervical back: Normal range of motion and neck supple.  Skin:    General: Skin is warm and dry.  Neurological:     Mental Status: She is alert and oriented to person, place, and time.     Cranial Nerves: No cranial nerve deficit.     Deep Tendon Reflexes: Reflexes are normal and symmetric.  Psychiatric:        Behavior: Behavior normal.        Thought Content: Thought content normal.        Judgment: Judgment normal.      BP (!) 133/92   Pulse 92   Temp 98 F (36.7 C) (Temporal)   Ht 5' 3"  (1.6 m)   Wt (!) 370 lb (167.8 kg)   SpO2 100%   BMI 65.54 kg/m       Assessment & Plan:  Paula Wells comes in today with chief complaint of Annual Exam (with pap)   Diagnosis and orders addressed:  1. Encounter for weight loss counseling - phentermine 37.5 MG capsule; Take 1 capsule by mouth once daily in the morning  Dispense: 30 capsule; Refill: 2 - CMP14+EGFR - CBC with Differential/Platelet - Amb Ref to Medical Weight Management  2. GAD (generalized anxiety disorder) - escitalopram (LEXAPRO) 20 MG tablet; Take 1 tablet (20 mg total) by mouth daily.  Dispense: 90 tablet; Refill: 4 - CMP14+EGFR - CBC with Differential/Platelet  3. Moderate episode of recurrent major depressive disorder (HCC) - escitalopram (LEXAPRO) 20 MG tablet; Take 1 tablet (20 mg total) by mouth daily.  Dispense: 90 tablet; Refill: 4 - CMP14+EGFR - CBC with Differential/Platelet  4. Annual physical exam - CMP14+EGFR - CBC with Differential/Platelet - Lipid panel - TSH - HIV Antibody (routine testing w rflx) - IGP, rfx Aptima HPV ASCU  5. GYN exam for high-risk Medicare patient - CMP14+EGFR - CBC with Differential/Platelet - IGP, rfx Aptima HPV ASCU  6. HIV screening declined  7. Screening for HIV (human immunodeficiency virus) - CMP14+EGFR - CBC with  Differential/Platelet - HIV Antibody (routine testing w rflx)  8. Morbid obesity ([place]) - CMP14+EGFR - CBC with Differential/Platelet - Amb Ref to Medical Weight Management   Labs pending Health Maintenance reviewed Diet and exercise encouraged  Follow up plan: 3 months    Evelina Dun, FNP

## 2019-05-11 NOTE — Patient Instructions (Signed)

## 2019-05-12 LAB — CBC WITH DIFFERENTIAL/PLATELET
Basophils Absolute: 0.1 10*3/uL (ref 0.0–0.2)
Basos: 0 %
EOS (ABSOLUTE): 0.2 10*3/uL (ref 0.0–0.4)
Eos: 2 %
Hematocrit: 38.5 % (ref 34.0–46.6)
Hemoglobin: 12.3 g/dL (ref 11.1–15.9)
Immature Grans (Abs): 0 10*3/uL (ref 0.0–0.1)
Immature Granulocytes: 0 %
Lymphocytes Absolute: 3.2 10*3/uL — ABNORMAL HIGH (ref 0.7–3.1)
Lymphs: 28 %
MCH: 27.9 pg (ref 26.6–33.0)
MCHC: 31.9 g/dL (ref 31.5–35.7)
MCV: 87 fL (ref 79–97)
Monocytes Absolute: 0.9 10*3/uL (ref 0.1–0.9)
Monocytes: 8 %
Neutrophils Absolute: 6.9 10*3/uL (ref 1.4–7.0)
Neutrophils: 62 %
Platelets: 630 10*3/uL — ABNORMAL HIGH (ref 150–450)
RBC: 4.41 x10E6/uL (ref 3.77–5.28)
RDW: 12.6 % (ref 11.7–15.4)
WBC: 11.4 10*3/uL — ABNORMAL HIGH (ref 3.4–10.8)

## 2019-05-12 LAB — LIPID PANEL
Chol/HDL Ratio: 2.6 ratio (ref 0.0–4.4)
Cholesterol, Total: 210 mg/dL — ABNORMAL HIGH (ref 100–199)
HDL: 82 mg/dL (ref >39)
LDL Chol Calc (NIH): 114 mg/dL — ABNORMAL HIGH (ref 0–99)
Triglycerides: 77 mg/dL (ref 0–149)
VLDL Cholesterol Cal: 14 mg/dL (ref 5–40)

## 2019-05-12 LAB — CMP14+EGFR
ALT: 38 IU/L — ABNORMAL HIGH (ref 0–32)
AST: 29 IU/L (ref 0–40)
Albumin/Globulin Ratio: 1.5 (ref 1.2–2.2)
Albumin: 4.4 g/dL (ref 3.9–5.0)
Alkaline Phosphatase: 106 IU/L (ref 39–117)
BUN/Creatinine Ratio: 18 (ref 9–23)
BUN: 12 mg/dL (ref 6–20)
Bilirubin Total: 0.3 mg/dL (ref 0.0–1.2)
CO2: 22 mmol/L (ref 20–29)
Calcium: 9.5 mg/dL (ref 8.7–10.2)
Chloride: 96 mmol/L (ref 96–106)
Creatinine, Ser: 0.65 mg/dL (ref 0.57–1.00)
GFR calc Af Amer: 140 mL/min/{1.73_m2} (ref >59)
GFR calc non Af Amer: 121 mL/min/{1.73_m2} (ref >59)
Globulin, Total: 2.9 g/dL (ref 1.5–4.5)
Glucose: 95 mg/dL (ref 65–99)
Potassium: 4.6 mmol/L (ref 3.5–5.2)
Sodium: 137 mmol/L (ref 134–144)
Total Protein: 7.3 g/dL (ref 6.0–8.5)

## 2019-05-12 LAB — TSH: TSH: 3.22 u[IU]/mL (ref 0.450–4.500)

## 2019-05-12 LAB — HIV ANTIBODY (ROUTINE TESTING W REFLEX): HIV Screen 4th Generation wRfx: NONREACTIVE

## 2019-05-14 ENCOUNTER — Other Ambulatory Visit: Payer: Self-pay | Admitting: Family

## 2019-05-14 DIAGNOSIS — D75839 Thrombocytosis, unspecified: Secondary | ICD-10-CM

## 2019-05-14 DIAGNOSIS — D473 Essential (hemorrhagic) thrombocythemia: Secondary | ICD-10-CM

## 2019-05-14 LAB — IGP, RFX APTIMA HPV ASCU

## 2019-08-10 ENCOUNTER — Ambulatory Visit: Payer: Medicaid Other | Admitting: Family

## 2019-08-13 ENCOUNTER — Encounter: Payer: Self-pay | Admitting: Family

## 2019-09-18 ENCOUNTER — Encounter: Payer: Self-pay | Admitting: Family

## 2019-09-18 ENCOUNTER — Other Ambulatory Visit: Payer: Self-pay

## 2019-09-18 ENCOUNTER — Ambulatory Visit (INDEPENDENT_AMBULATORY_CARE_PROVIDER_SITE_OTHER): Payer: Self-pay | Admitting: Family

## 2019-09-18 VITALS — BP 138/97 | HR 90 | Temp 97.7°F | Ht 63.0 in | Wt 374.2 lb

## 2019-09-18 DIAGNOSIS — I10 Essential (primary) hypertension: Secondary | ICD-10-CM

## 2019-09-18 MED ORDER — AMLODIPINE BESYLATE 5 MG PO TABS: 5.0000 mg | ORAL_TABLET | Freq: Every day | ORAL | 3 refills | Status: DC

## 2019-09-18 NOTE — Progress Notes (Signed)
Subjective:    Patient ID: Paula Wells, female    DOB: 1990-03-06, 29 y.o.   MRN: [id number]  Chief Complaint  Patient presents with  . Hypertension  . Heat Exposure    Pt presents to the office today for chronic follow up. 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 They had to call medical team to get checked out. She was told her BP was elevated and she had some heat exhaustion. She reports she has been doing better and trying to drink more fluids.  Hypertension This is a new problem. The current episode started more than 1 year ago. The problem has been waxing and waning since onset. The problem is uncontrolled. Associated symptoms include malaise/fatigue. Pertinent negatives include no peripheral edema or shortness of breath. Risk factors for coronary artery disease include dyslipidemia, obesity and sedentary lifestyle. The current treatment provides moderate improvement. There is no history of kidney disease, CAD/MI, CVA or heart failure.      Review of Systems  Constitutional: Positive for malaise/fatigue.  Respiratory: Negative for shortness of breath.   All other systems reviewed and are negative.      Objective:   Physical Exam Vitals reviewed.  Constitutional:      General: She is not in acute distress.    Appearance: She is well-developed. She is obese.  HENT:     Head: Normocephalic and atraumatic.     Right Ear: Tympanic membrane normal.     Left Ear: Tympanic membrane normal.  Eyes:     Pupils: Pupils are equal, round, and reactive to light.  Neck:     Thyroid: No thyromegaly.  Cardiovascular:     Rate and Rhythm: Normal rate and regular rhythm.     Heart sounds: Normal heart sounds. No murmur heard.   Pulmonary:     Effort: Pulmonary effort is normal. No respiratory distress.     Breath sounds: Normal breath sounds. No wheezing.  Abdominal:     General: Bowel sounds are normal. There is  no distension.     Palpations: Abdomen is soft.     Tenderness: There is no abdominal tenderness.  Musculoskeletal:        General: No tenderness. Normal range of motion.     Cervical back: Normal range of motion and neck supple.  Skin:    General: Skin is warm and dry.  Neurological:     Mental Status: She is alert and oriented to person, place, and time.     Cranial Nerves: No cranial nerve deficit.     Deep Tendon Reflexes: Reflexes are normal and symmetric.  Psychiatric:        Behavior: Behavior normal.        Thought Content: Thought content normal.        Judgment: Judgment normal.       BP (!) 138/97   Pulse 90   Temp 97.7 F (36.5 C) (Temporal)   Ht 5\' 3"  (1.6 m)   Wt (!) 374 lb 3.2 oz (169.7 kg)   SpO2 100%   BMI 66.29 kg/m      Assessment & Plan:  Eulala Newcombe comes in today with chief complaint of Hypertension and Heat Exposure   Diagnosis and orders addressed:  1. Morbid obesity ([place])  2. Essential hypertension Will add Norvasc 5 mg  -Daily blood pressure log given with instructions on how to fill out and  told to bring to next visit -Dash diet information given -Exercise encouraged - Stress Management  -Continue current meds -RTO in 3 months  - amLODipine (NORVASC) 5 MG tablet; Take 1 tablet (5 mg total) by mouth daily.  Dispense: 90 tablet; Refill: [place], FNP

## 2019-09-18 NOTE — Patient Instructions (Signed)

## 2019-09-27 ENCOUNTER — Telehealth: Payer: Self-pay | Admitting: Family

## 2019-09-27 NOTE — Telephone Encounter (Signed)
Pt called requesting that Alyse Low write a work note for pt stating that it is ok for her to return back to work. Please call pt when ready.

## 2019-09-27 NOTE — Telephone Encounter (Signed)
Work note sent to MyChart.  

## 2019-09-27 NOTE — Telephone Encounter (Signed)
Pt aware.

## 2019-11-08 ENCOUNTER — Other Ambulatory Visit: Payer: Self-pay | Admitting: Family

## 2019-11-08 DIAGNOSIS — Z713 Dietary counseling and surveillance: Secondary | ICD-10-CM

## 2019-12-19 ENCOUNTER — Emergency Department (HOSPITAL_COMMUNITY)
Admission: EM | Admit: 2019-12-19 | Discharge: 2019-12-19 | Disposition: A | Discharge status: Home / Self Care | Payer: Self-pay | Attending: Emergency Medicine | Admitting: Emergency Medicine

## 2019-12-19 ENCOUNTER — Encounter (HOSPITAL_COMMUNITY): Payer: Self-pay

## 2019-12-19 ENCOUNTER — Other Ambulatory Visit: Payer: Self-pay

## 2019-12-19 ENCOUNTER — Emergency Department (HOSPITAL_COMMUNITY): Payer: Self-pay

## 2019-12-19 DIAGNOSIS — Y92410 Unspecified street and highway as the place of occurrence of the external cause: Secondary | ICD-10-CM | POA: Diagnosis not present

## 2019-12-19 DIAGNOSIS — Y9389 Activity, other specified: Secondary | ICD-10-CM | POA: Insufficient documentation

## 2019-12-19 DIAGNOSIS — S299XXA Unspecified injury of thorax, initial encounter: Secondary | ICD-10-CM | POA: Diagnosis present

## 2019-12-19 DIAGNOSIS — S2020XA Contusion of thorax, unspecified, initial encounter: Secondary | ICD-10-CM | POA: Diagnosis not present

## 2019-12-19 DIAGNOSIS — S1091XA Abrasion of unspecified part of neck, initial encounter: Secondary | ICD-10-CM | POA: Insufficient documentation

## 2019-12-19 DIAGNOSIS — S8012XA Contusion of left lower leg, initial encounter: Secondary | ICD-10-CM | POA: Diagnosis not present

## 2019-12-19 DIAGNOSIS — S20219A Contusion of unspecified front wall of thorax, initial encounter: Secondary | ICD-10-CM
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
## 2020-01-03 ENCOUNTER — Emergency Department (HOSPITAL_COMMUNITY)
Admission: EM | Admit: 2020-01-03 | Discharge: 2020-01-03 | Disposition: A | Discharge status: Home / Self Care | Payer: No Typology Code available for payment source

## 2020-01-07 ENCOUNTER — Encounter: Payer: Self-pay | Admitting: Nurse Practitioner

## 2020-01-07 ENCOUNTER — Ambulatory Visit (INDEPENDENT_AMBULATORY_CARE_PROVIDER_SITE_OTHER): Payer: Medicaid Other | Admitting: Nurse Practitioner

## 2020-01-07 ENCOUNTER — Other Ambulatory Visit: Payer: Self-pay

## 2020-01-07 VITALS — BP 128/81 | HR 90 | Temp 97.2°F | Ht 63.0 in | Wt 382.0 lb

## 2020-01-07 DIAGNOSIS — S81802A Unspecified open wound, left lower leg, initial encounter: Secondary | ICD-10-CM | POA: Insufficient documentation

## 2020-01-07 NOTE — Patient Instructions (Signed)
Wound Care, Adult Taking care of your wound properly can help to prevent pain, infection, and scarring. It can also help your wound to heal more quickly. How to care for your wound Wound care      Follow instructions from your health care provider about how to take care of your wound. Make sure you: ? Wash your hands with soap and water before you change the bandage (dressing). If soap and water are not available, use hand sanitizer. ? Change your dressing as told by your health care provider. ? Leave stitches (sutures), skin glue, or adhesive strips in place. These skin closures may need to stay in place for 2 weeks or longer. If adhesive strip edges start to loosen and curl up, you may trim the loose edges. Do not remove adhesive strips completely unless your health care provider tells you to do that.  Check your wound area every day for signs of infection. Check for: ? Redness, swelling, or pain. ? Fluid or blood. ? Warmth. ? Pus or a bad smell.  Ask your health care provider if you should clean the wound with mild soap and water. Doing this may include: ? Using a clean towel to pat the wound dry after cleaning it. Do not rub or scrub the wound. ? Applying a cream or ointment. Do this only as told by your health care provider. ? Covering the incision with a clean dressing.  Ask your health care provider when you can leave the wound uncovered.  Keep the dressing dry until your health care provider says it can be removed. Do not take baths, swim, use a hot tub, or do anything that would put the wound underwater until your health care provider approves. Ask your health care provider if you can take showers. You may only be allowed to take sponge baths. Medicines   If you were prescribed an antibiotic medicine, cream, or ointment, take or use the antibiotic as told by your health care provider. Do not stop taking or using the antibiotic even if your condition improves.  Take  over-the-counter and prescription medicines only as told by your health care provider. If you were prescribed pain medicine, take it 30 or more minutes before you do any wound care or as told by your health care provider. General instructions  Return to your normal activities as told by your health care provider. Ask your health care provider what activities are safe.  Do not scratch or pick at the wound.  Do not use any products that contain nicotine or tobacco, such as cigarettes and e-cigarettes. These may delay wound healing. If you need help quitting, ask your health care provider.  Keep all follow-up visits as told by your health care provider. This is important.  Eat a diet that includes protein, vitamin A, vitamin C, and other nutrient-rich foods to help the wound heal. ? Foods rich in protein include meat, dairy, beans, nuts, and other sources. ? Foods rich in vitamin A include carrots and dark green, leafy vegetables. ? Foods rich in vitamin C include citrus, tomatoes, and other fruits and vegetables. ? Nutrient-rich foods have protein, carbohydrates, fat, vitamins, or minerals. Eat a variety of healthy foods including vegetables, fruits, and whole grains. Contact a health care provider if:  You received a tetanus shot and you have swelling, severe pain, redness, or bleeding at the injection site.  Your pain is not controlled with medicine.  You have redness, swelling, or pain around the wound.    You have fluid or blood coming from the wound.  Your wound feels warm to the touch.  You have pus or a bad smell coming from the wound.  You have a fever or chills.  You are nauseous or you vomit.  You are dizzy. Get help right away if:  You have a red streak going away from your wound.  The edges of the wound open up and separate.  Your wound is bleeding, and the bleeding does not stop with gentle pressure.  You have a rash.  You faint.  You have trouble  breathing. Summary  Always wash your hands with soap and water before changing your bandage (dressing).  To help with healing, eat foods that are rich in protein, vitamin A, vitamin C, and other nutrients.  Check your wound every day for signs of infection. Contact your health care provider if you suspect that your wound is infected. This information is not intended to replace advice given to you by your health care provider. Make sure you discuss any questions you have with your health care provider. Document Revised: 05/08/2018 Document Reviewed: 08/05/2015 Elsevier Patient Education  2020 [employer]  
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
## 2020-01-16 ENCOUNTER — Encounter: Payer: Self-pay | Admitting: Family

## 2020-01-16 ENCOUNTER — Ambulatory Visit: Payer: Medicaid Other | Admitting: Family

## 2020-07-30 ENCOUNTER — Encounter: Payer: Self-pay | Admitting: Family Medicine

## 2021-03-17 ENCOUNTER — Encounter: Payer: Medicaid Other | Admitting: Family

## 2021-03-24 ENCOUNTER — Encounter: Payer: Medicaid Other | Admitting: Family

## 2021-03-27 ENCOUNTER — Ambulatory Visit (INDEPENDENT_AMBULATORY_CARE_PROVIDER_SITE_OTHER): Payer: Self-pay | Admitting: Family

## 2021-03-27 ENCOUNTER — Encounter: Payer: Self-pay | Admitting: Family

## 2021-03-27 VITALS — BP 129/79 | HR 77 | Temp 97.8°F | Ht 63.0 in | Wt 391.8 lb

## 2021-03-27 DIAGNOSIS — F331 Major depressive disorder, recurrent, moderate: Secondary | ICD-10-CM

## 2021-03-27 DIAGNOSIS — Z713 Dietary counseling and surveillance: Secondary | ICD-10-CM

## 2021-03-27 DIAGNOSIS — F411 Generalized anxiety disorder: Secondary | ICD-10-CM

## 2021-03-27 DIAGNOSIS — I10 Essential (primary) hypertension: Secondary | ICD-10-CM

## 2021-03-27 LAB — CMP14+EGFR
ALT: 24 IU/L (ref 0–32)
AST: 20 IU/L (ref 0–40)
Albumin/Globulin Ratio: 1.7 (ref 1.2–2.2)
Albumin: 4.2 g/dL (ref 3.9–5.0)
Alkaline Phosphatase: 90 IU/L (ref 44–121)
BUN/Creatinine Ratio: 18 (ref 9–23)
BUN: 11 mg/dL (ref 6–20)
Bilirubin Total: 0.2 mg/dL (ref 0.0–1.2)
CO2: 25 mmol/L (ref 20–29)
Calcium: 9.6 mg/dL (ref 8.7–10.2)
Chloride: 100 mmol/L (ref 96–106)
Creatinine, Ser: 0.61 mg/dL (ref 0.57–1.00)
Globulin, Total: 2.5 g/dL (ref 1.5–4.5)
Glucose: 107 mg/dL — ABNORMAL HIGH (ref 70–99)
Potassium: 4.4 mmol/L (ref 3.5–5.2)
Sodium: 139 mmol/L (ref 134–144)
Total Protein: 6.7 g/dL (ref 6.0–8.5)
eGFR: 123 mL/min/{1.73_m2} (ref >59)

## 2021-03-27 MED ORDER — AMLODIPINE BESYLATE 5 MG PO TABS: 5.0000 mg | ORAL_TABLET | Freq: Every day | ORAL | 3 refills | Status: DC

## 2021-03-27 MED ORDER — PHENTERMINE HCL 37.5 MG PO CAPS: 37.5000 mg | ORAL_CAPSULE | Freq: Every morning | ORAL | 2 refills | Status: DC

## 2021-03-27 MED ORDER — ESCITALOPRAM OXALATE 20 MG PO TABS: 20.0000 mg | ORAL_TABLET | Freq: Every day | ORAL | 4 refills | Status: DC

## 2021-03-27 NOTE — Patient Instructions (Signed)

## 2021-03-27 NOTE — Progress Notes (Signed)
Subjective:    Patient ID: Paula Wells, female    DOB: August 10, 1990, 31 y.o.   MRN: [id number]  Chief Complaint  Patient presents with   Weight Loss   Pt presents to the office today for chronic follow up. She is requesting to restart phentermine. She has not taken in a year. She has gained 18 lbs since our last visit.   She states she just got a job and has money now to buy "healthier foods". She is not doing any scheduled exercise.  Hypertension This is a chronic problem. The current episode started more than 1 year ago. The problem has been resolved since onset. The problem is controlled. Associated symptoms include anxiety. Pertinent negatives include no malaise/fatigue, peripheral edema or shortness of breath. Risk factors for coronary artery disease include dyslipidemia, obesity and sedentary lifestyle. The current treatment provides moderate improvement.  Anxiety Presents for follow-up visit. Symptoms include depressed mood, excessive worry, irritability and restlessness. Patient reports no shortness of breath. Symptoms occur occasionally. The severity of symptoms is moderate.    Depression        Associated symptoms include restlessness and sad.  Associated symptoms include no helplessness (improved since starting her job), no hopelessness and not irritable.  Past treatments include SSRIs - Selective serotonin reuptake inhibitors.  Past medical history includes anxiety.      Review of Systems  Constitutional:  Positive for irritability. Negative for malaise/fatigue.  Respiratory:  Negative for shortness of breath.   Psychiatric/Behavioral:  Positive for depression.   All other systems reviewed and are negative.     Objective:   Physical Exam Vitals reviewed.  Constitutional:      General: She is not irritable.She is not in acute distress.    Appearance: She is well-developed. She is obese.  HENT:     Head: Normocephalic and atraumatic.     Right Ear: Tympanic membrane  normal.     Left Ear: Tympanic membrane normal.  Eyes:     Pupils: Pupils are equal, round, and reactive to light.  Neck:     Thyroid: No thyromegaly.  Cardiovascular:     Rate and Rhythm: Normal rate and regular rhythm.     Heart sounds: Normal heart sounds. No murmur heard. Pulmonary:     Effort: Pulmonary effort is normal. No respiratory distress.     Breath sounds: Normal breath sounds. No wheezing.  Abdominal:     General: Bowel sounds are normal. There is no distension.     Palpations: Abdomen is soft.     Tenderness: There is no abdominal tenderness.  Musculoskeletal:        General: No tenderness. Normal range of motion.     Cervical back: Normal range of motion and neck supple.  Skin:    General: Skin is warm and dry.  Neurological:     Mental Status: She is alert and oriented to person, place, and time.     Cranial Nerves: No cranial nerve deficit.     Deep Tendon Reflexes: Reflexes are normal and symmetric.  Psychiatric:        Behavior: Behavior normal.        Thought Content: Thought content normal.        Judgment: Judgment normal.       BP 129/79    Pulse 77    Temp 97.8 F (36.6 C) (Temporal)    Ht 5' 3"  (1.6 m)    Wt (!) 391 lb 12.8 oz (177.7 kg)  BMI 69.40 kg/m   Assessment & Plan:  Paula Wells comes in today with chief complaint of Weight Loss   Diagnosis and orders addressed:  1. Encounter for weight loss counseling - phentermine 37.5 MG capsule; Take 1 capsule (37.5 mg total) by mouth every morning.  Dispense: 30 capsule; Refill: 2 - CMP14+EGFR  2. Essential hypertension - amLODipine (NORVASC) 5 MG tablet; Take 1 tablet (5 mg total) by mouth daily.  Dispense: 90 tablet; Refill: 3 - CMP14+EGFR  3. GAD (generalized anxiety disorder) - escitalopram (LEXAPRO) 20 MG tablet; Take 1 tablet (20 mg total) by mouth daily.  Dispense: 90 tablet; Refill: 4 - CMP14+EGFR  4. Moderate episode of recurrent major depressive disorder (HCC) -  escitalopram (LEXAPRO) 20 MG tablet; Take 1 tablet (20 mg total) by mouth daily.  Dispense: 90 tablet; Refill: 4 - CMP14+EGFR  5. Morbid obesity ([place]) - CMP14+EGFR  Will restart phentermine, encourage scheduled exercise and healthy diet Labs pending Health Maintenance reviewed Diet and exercise encouraged  Follow up plan: 3 months   Paula Dun, FNP

## 2021-06-26 ENCOUNTER — Ambulatory Visit: Payer: Self-pay | Admitting: Family

## 2021-08-13 ENCOUNTER — Ambulatory Visit: Payer: Medicaid Other | Admitting: Family

## 2021-08-19 ENCOUNTER — Encounter: Payer: Self-pay | Admitting: Family
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
## 2022-04-02 DIAGNOSIS — Z419 Encounter for procedure for purposes other than remedying health state, unspecified: Secondary | ICD-10-CM | POA: Diagnosis not present

## 2022-04-13 ENCOUNTER — Ambulatory Visit (INDEPENDENT_AMBULATORY_CARE_PROVIDER_SITE_OTHER): Payer: Medicaid Other | Admitting: Family

## 2022-04-13 ENCOUNTER — Other Ambulatory Visit (HOSPITAL_COMMUNITY)
Admission: RE | Admit: 2022-04-13 | Discharge: 2022-04-13 | Disposition: A | Discharge status: Home / Self Care | Payer: Medicaid Other | Source: Ambulatory Visit | Attending: Family | Admitting: Family

## 2022-04-13 ENCOUNTER — Encounter: Payer: Self-pay | Admitting: Family

## 2022-04-13 VITALS — BP 142/100 | HR 98 | Temp 97.6°F | Ht 63.0 in | Wt 396.4 lb

## 2022-04-13 DIAGNOSIS — Z23 Encounter for immunization: Secondary | ICD-10-CM

## 2022-04-13 DIAGNOSIS — Z0000 Encounter for general adult medical examination without abnormal findings: Secondary | ICD-10-CM

## 2022-04-13 DIAGNOSIS — Z01419 Encounter for gynecological examination (general) (routine) without abnormal findings: Secondary | ICD-10-CM | POA: Diagnosis not present

## 2022-04-13 DIAGNOSIS — Z713 Dietary counseling and surveillance: Secondary | ICD-10-CM | POA: Diagnosis not present

## 2022-04-13 DIAGNOSIS — F331 Major depressive disorder, recurrent, moderate: Secondary | ICD-10-CM | POA: Diagnosis not present

## 2022-04-13 DIAGNOSIS — Z1159 Encounter for screening for other viral diseases: Secondary | ICD-10-CM

## 2022-04-13 DIAGNOSIS — F411 Generalized anxiety disorder: Secondary | ICD-10-CM | POA: Diagnosis not present

## 2022-04-13 DIAGNOSIS — Z01411 Encounter for gynecological examination (general) (routine) with abnormal findings: Secondary | ICD-10-CM

## 2022-04-13 DIAGNOSIS — I10 Essential (primary) hypertension: Secondary | ICD-10-CM | POA: Diagnosis not present

## 2022-04-13 DIAGNOSIS — R879 Unspecified abnormal finding in specimens from female genital organs: Secondary | ICD-10-CM | POA: Diagnosis not present

## 2022-04-13 MED ORDER — AMLODIPINE BESYLATE 5 MG PO TABS: 5.0000 mg | ORAL_TABLET | Freq: Every day | ORAL | 3 refills | Status: DC

## 2022-04-13 MED ORDER — ESCITALOPRAM OXALATE 20 MG PO TABS: 20.0000 mg | ORAL_TABLET | Freq: Every day | ORAL | 4 refills | Status: AC

## 2022-04-13 MED ORDER — PHENTERMINE HCL 37.5 MG PO CAPS: 37.5000 mg | ORAL_CAPSULE | Freq: Every morning | ORAL | 2 refills | Status: DC

## 2022-04-13 NOTE — Progress Notes (Signed)
Subjective:    Patient ID: Paula Wells, female    DOB: Sep 14, 1990, 32 y.o.   MRN: [id number]  Chief Complaint  Patient presents with   Annual Exam    Discuss wt loss   Pt presents to the office today for CPE with pap. She is requesting to restart phentermine. She has not taken since last year. She has gained 5 lbs since our last visit.     04/13/2022    9:05 AM 03/27/2021   10:18 AM 01/07/2020    9:00 AM  Last 3 Weights  Weight (lbs) 396 lb 6.4 oz 391 lb 12.8 oz 382 lb  Weight (kg) 179.806 kg 177.719 kg 173.274 kg      She is not doing any scheduled exercise.   Hypertension This is a chronic problem. The current episode started more than 1 year ago. The problem has been waxing and waning since onset. The problem is uncontrolled. Associated symptoms include anxiety, malaise/fatigue and shortness of breath. Pertinent negatives include no peripheral edema. Risk factors for coronary artery disease include dyslipidemia and obesity. The current treatment provides mild improvement.  Anxiety Presents for follow-up visit. Symptoms include depressed mood, excessive worry, irritability, nervous/anxious behavior, restlessness and shortness of breath. Symptoms occur occasionally. The severity of symptoms is mild.    Depression        This is a chronic problem.  The current episode started more than 1 year ago.   Associated symptoms include helplessness, hopelessness, restlessness and sad.  Past treatments include SSRIs - Selective serotonin reuptake inhibitors.  Past medical history includes anxiety.   Gynecologic Exam The patient's pertinent negatives include no genital itching, genital odor, vaginal bleeding or vaginal discharge. The current episode started more than 1 year ago. The problem occurs intermittently.      Review of Systems  Constitutional:  Positive for irritability and malaise/fatigue.  Respiratory:  Positive for shortness of breath.   Genitourinary:  Negative for vaginal  discharge.  Psychiatric/Behavioral:  Positive for depression. The patient is nervous/anxious.   All other systems reviewed and are negative.  Family History  Problem Relation Age of Onset   Diabetes Mother    Birth defects Brother    Social History   Socioeconomic History   Marital status: Single    Spouse name: Not on file   Number of children: Not on file   Years of education: Not on file   Highest education level: Not on file  Occupational History   Not on file  Tobacco Use   Smoking status: Never   Smokeless tobacco: Never  Substance and Sexual Activity   Alcohol use: No    Alcohol/week: 0.0 standard drinks of alcohol   Drug use: No   Sexual activity: Never  Other Topics Concern   Not on file  Social History Narrative   Not on file   Social Determinants of Health   Financial Resource Strain: Not on file  Food Insecurity: Not on file  Transportation Needs: Not on file  Physical Activity: Not on file  Stress: Not on file  Social Connections: Not on file       Objective:   Physical Exam Vitals reviewed.  Constitutional:      General: She is not in acute distress.    Appearance: She is well-developed. She is obese.  HENT:     Head: Normocephalic and atraumatic.     Right Ear: Tympanic membrane normal.     Left Ear: Tympanic membrane normal.  Eyes:  Pupils: Pupils are equal, round, and reactive to light.  Neck:     Thyroid: No thyromegaly.  Cardiovascular:     Rate and Rhythm: Normal rate and regular rhythm.     Heart sounds: Normal heart sounds. No murmur heard. Pulmonary:     Effort: Pulmonary effort is normal. No respiratory distress.     Breath sounds: Normal breath sounds. No wheezing.  Chest:  Breasts:    Right: No swelling, bleeding, inverted nipple, mass, nipple discharge, skin change or tenderness.     Left: No swelling, bleeding, inverted nipple, mass, nipple discharge, skin change or tenderness.  Abdominal:     General: Bowel sounds are  normal. There is no distension.     Palpations: Abdomen is soft.     Tenderness: There is no abdominal tenderness.  Genitourinary:    Comments: Bimanual exam- no adnexal masses or tenderness, ovaries nonpalpable   Cervix parous and pink- No discharge  Musculoskeletal:        General: No tenderness. Normal range of motion.     Cervical back: Normal range of motion and neck supple.  Skin:    General: Skin is warm and dry.  Neurological:     Mental Status: She is alert and oriented to person, place, and time.     Cranial Nerves: No cranial nerve deficit.     Deep Tendon Reflexes: Reflexes are normal and symmetric.  Psychiatric:        Behavior: Behavior normal.        Thought Content: Thought content normal.        Judgment: Judgment normal.       BP (!) 142/100   Pulse 98   Temp 97.6 F (36.4 C) (Temporal)   Ht '5\' 3"'$  (1.6 m)   Wt (!) 396 lb 6.4 oz (179.8 kg)   LMP 03/15/2022   SpO2 100%   BMI 70.22 kg/m      Assessment & Plan:   Tijuana Hartin comes in today with chief complaint of Annual Exam (Discuss wt loss)   Diagnosis and orders addressed:  1. Essential hypertension - amLODipine (NORVASC) 5 MG tablet; Take 1 tablet (5 mg total) by mouth daily.  Dispense: 90 tablet; Refill: 3 - CMP14+EGFR - CBC with Differential/Platelet  2. GAD (generalized anxiety disorder) - escitalopram (LEXAPRO) 20 MG tablet; Take 1 tablet (20 mg total) by mouth daily.  Dispense: 90 tablet; Refill: 4 - CMP14+EGFR - CBC with Differential/Platelet - Amb Ref to Medical Weight Management  3. Moderate episode of recurrent major depressive disorder (HCC) - escitalopram (LEXAPRO) 20 MG tablet; Take 1 tablet (20 mg total) by mouth daily.  Dispense: 90 tablet; Refill: 4 - CMP14+EGFR - CBC with Differential/Platelet  4. Encounter for weight loss counseling - phentermine 37.5 MG capsule; Take 1 capsule (37.5 mg total) by mouth every morning.  Dispense: 30 capsule; Refill: 2 - CMP14+EGFR -  CBC with Differential/Platelet - Amb Ref to Medical Weight Management  5. Annual physical exam - CMP14+EGFR - CBC with Differential/Platelet  6. Encounter for gynecological examination without abnormal finding - CMP14+EGFR - CBC with Differential/Platelet - Lipid panel - TSH - Hepatitis C antibody - Cytology - PAP([place])  7. Need for hepatitis C screening test - CMP14+EGFR - CBC with Differential/Platelet - Hepatitis C antibody  8. Morbid obesity ([place]) - Amb Ref to Medical Weight Management   Labs pending Health Maintenance reviewed Diet and exercise encouraged  Follow up plan: 3 months    Evelina Dun, FNP

## 2022-04-13 NOTE — Patient Instructions (Signed)
Calorie Counting for Weight Loss Calories are units of energy. Your body needs a certain number of calories from food to keep going throughout the day. When you eat or drink more calories than your body needs, your body stores the extra calories mostly as fat. When you eat or drink fewer calories than your body needs, your body burns fat to get the energy it needs. Calorie counting means keeping track of how many calories you eat and drink each day. Calorie counting can be helpful if you need to lose weight. If you eat fewer calories than your body needs, you should lose weight. Ask your health care provider what a healthy weight is for you. For calorie counting to work, you will need to eat the right number of calories each day to lose a healthy amount of weight per week. A dietitian can help you figure out how many calories you need in a day and will suggest ways to reach your calorie goal. A healthy amount of weight to lose each week is usually 1-2 lb (0.5-0.9 kg). This usually means that your daily calorie intake should be reduced by 500-750 calories. Eating 1,200-1,500 calories a day can help most women lose weight. Eating 1,500-1,800 calories a day can help most men lose weight. What do I need to know about calorie counting? Work with your health care provider or dietitian to determine how many calories you should get each day. To meet your daily calorie goal, you will need to: Find out how many calories are in each food that you would like to eat. Try to do this before you eat. Decide how much of the food you plan to eat. Keep a food log. Do this by writing down what you ate and how many calories it had. To successfully lose weight, it is important to balance calorie counting with a healthy lifestyle that includes regular activity. Where do I find calorie information?  The number of calories in a food can be found on a Nutrition Facts label. If a food does not have a Nutrition Facts label, try  to look up the calories online or ask your dietitian for help. Remember that calories are listed per serving. If you choose to have more than one serving of a food, you will have to multiply the calories per serving by the number of servings you plan to eat. For example, the label on a package of bread might say that a serving size is 1 slice and that there are 90 calories in a serving. If you eat 1 slice, you will have eaten 90 calories. If you eat 2 slices, you will have eaten 180 calories. How do I keep a food log? After each time that you eat, record the following in your food log as soon as possible: What you ate. Be sure to include toppings, sauces, and other extras on the food. How much you ate. This can be measured in cups, ounces, or number of items. How many calories were in each food and drink. The total number of calories in the food you ate. Keep your food log near you, such as in a pocket-sized notebook or on an app or website on your mobile phone. Some programs will calculate calories for you and show you how many calories you have left to meet your daily goal. What are some portion-control tips? Know how many calories are in a serving. This will help you know how many servings you can have of a certain   food. Use a measuring cup to measure serving sizes. You could also try weighing out portions on a kitchen scale. With time, you will be able to estimate serving sizes for some foods. Take time to put servings of different foods on your favorite plates or in your favorite bowls and cups so you know what a serving looks like. Try not to eat straight from a food's packaging, such as from a bag or box. Eating straight from the package makes it hard to see how much you are eating and can lead to overeating. Put the amount you would like to eat in a cup or on a plate to make sure you are eating the right portion. Use smaller plates, glasses, and bowls for smaller portions and to prevent  overeating. Try not to multitask. For example, avoid watching TV or using your computer while eating. If it is time to eat, sit down at a table and enjoy your food. This will help you recognize when you are full. It will also help you be more mindful of what and how much you are eating. What are tips for following this plan? Reading food labels Check the calorie count compared with the serving size. The serving size may be smaller than what you are used to eating. Check the source of the calories. Try to choose foods that are high in protein, fiber, and vitamins, and low in saturated fat, trans fat, and sodium. Shopping Read nutrition labels while you shop. This will help you make healthy decisions about which foods to buy. Pay attention to nutrition labels for low-fat or fat-free foods. These foods sometimes have the same number of calories or more calories than the full-fat versions. They also often have added sugar, starch, or salt to make up for flavor that was removed with the fat. Make a grocery list of lower-calorie foods and stick to it. Cooking Try to cook your favorite foods in a healthier way. For example, try baking instead of frying. Use low-fat dairy products. Meal planning Use more fruits and vegetables. One-half of your plate should be fruits and vegetables. Include lean proteins, such as chicken, turkey, and fish. Lifestyle Each week, aim to do one of the following: 150 minutes of moderate exercise, such as walking. 75 minutes of vigorous exercise, such as running. General information Know how many calories are in the foods you eat most often. This will help you calculate calorie counts faster. Find a way of tracking calories that works for you. Get creative. Try different apps or programs if writing down calories does not work for you. What foods should I eat?  Eat nutritious foods. It is better to have a nutritious, high-calorie food, such as an avocado, than a food with  few nutrients, such as a bag of potato chips. Use your calories on foods and drinks that will fill you up and will not leave you hungry soon after eating. Examples of foods that fill you up are nuts and nut butters, vegetables, lean proteins, and high-fiber foods such as whole grains. High-fiber foods are foods with more than 5 g of fiber per serving. Pay attention to calories in drinks. Low-calorie drinks include water and unsweetened drinks. The items listed above may not be a complete list of foods and beverages you can eat. Contact a dietitian for more information. What foods should I limit? Limit foods or drinks that are not good sources of vitamins, minerals, or protein or that are high in unhealthy fats. These   include: Candy. Other sweets. Sodas, specialty coffee drinks, alcohol, and juice. The items listed above may not be a complete list of foods and beverages you should avoid. Contact a dietitian for more information. How do I count calories when eating out? Pay attention to portions. Often, portions are much larger when eating out. Try these tips to keep portions smaller: Consider sharing a meal instead of getting your own. If you get your own meal, eat only half of it. Before you start eating, ask for a container and put half of your meal into it. When available, consider ordering smaller portions from the menu instead of full portions. Pay attention to your food and drink choices. Knowing the way food is cooked and what is included with the meal can help you eat fewer calories. If calories are listed on the menu, choose the lower-calorie options. Choose dishes that include vegetables, fruits, whole grains, low-fat dairy products, and lean proteins. Choose items that are boiled, broiled, grilled, or steamed. Avoid items that are buttered, battered, fried, or served with cream sauce. Items labeled as crispy are usually fried, unless stated otherwise. Choose water, low-fat milk,  unsweetened iced tea, or other drinks without added sugar. If you want an alcoholic beverage, choose a lower-calorie option, such as a glass of wine or light beer. Ask for dressings, sauces, and syrups on the side. These are usually high in calories, so you should limit the amount you eat. If you want a salad, choose a garden salad and ask for grilled meats. Avoid extra toppings such as bacon, cheese, or fried items. Ask for the dressing on the side, or ask for olive oil and vinegar or lemon to use as dressing. Estimate how many servings of a food you are given. Knowing serving sizes will help you be aware of how much food you are eating at restaurants. Where to find more information Centers for Disease Control and Prevention: www.cdc.gov [place] Department of Agriculture: myplate.gov Summary Calorie counting means keeping track of how many calories you eat and drink each day. If you eat fewer calories than your body needs, you should lose weight. A healthy amount of weight to lose per week is usually 1-2 lb (0.5-0.9 kg). This usually means reducing your daily calorie intake by 500-750 calories. The number of calories in a food can be found on a Nutrition Facts label. If a food does not have a Nutrition Facts label, try to look up the calories online or ask your dietitian for help. Use smaller plates, glasses, and bowls for smaller portions and to prevent overeating. Use your calories on foods and drinks that will fill you up and not leave you hungry shortly after a meal. This information is not intended to replace advice given to you by your health care provider. Make sure you discuss any questions you have with your health care provider. Document Revised: 03/01/2019 Document Reviewed: 03/01/2019 Elsevier Patient Education  2023 [employer]  

## 2022-04-14 LAB — CMP14+EGFR
ALT: 23 IU/L (ref 0–32)
AST: 25 IU/L (ref 0–40)
Albumin/Globulin Ratio: 1.6 (ref 1.2–2.2)
Albumin: 4.4 g/dL (ref 3.9–4.9)
Alkaline Phosphatase: 98 IU/L (ref 44–121)
BUN/Creatinine Ratio: 20 (ref 9–23)
BUN: 14 mg/dL (ref 6–20)
Bilirubin Total: 0.3 mg/dL (ref 0.0–1.2)
CO2: 21 mmol/L (ref 20–29)
Calcium: 10.1 mg/dL (ref 8.7–10.2)
Chloride: 97 mmol/L (ref 96–106)
Creatinine, Ser: 0.69 mg/dL (ref 0.57–1.00)
Globulin, Total: 2.8 g/dL (ref 1.5–4.5)
Glucose: 116 mg/dL — ABNORMAL HIGH (ref 70–99)
Potassium: 4.9 mmol/L (ref 3.5–5.2)
Sodium: 138 mmol/L (ref 134–144)
Total Protein: 7.2 g/dL (ref 6.0–8.5)
eGFR: 119 mL/min/{1.73_m2} (ref >59)

## 2022-04-14 LAB — CBC WITH DIFFERENTIAL/PLATELET
Basophils Absolute: 0.1 10*3/uL (ref 0.0–0.2)
Basos: 1 %
EOS (ABSOLUTE): 0.1 10*3/uL (ref 0.0–0.4)
Eos: 1 %
Hematocrit: 38.9 % (ref 34.0–46.6)
Hemoglobin: 12.1 g/dL (ref 11.1–15.9)
Immature Grans (Abs): 0 10*3/uL (ref 0.0–0.1)
Immature Granulocytes: 0 %
Lymphocytes Absolute: 3.8 10*3/uL — ABNORMAL HIGH (ref 0.7–3.1)
Lymphs: 38 %
MCH: 25.2 pg — ABNORMAL LOW (ref 26.6–33.0)
MCHC: 31.1 g/dL — ABNORMAL LOW (ref 31.5–35.7)
MCV: 81 fL (ref 79–97)
Monocytes Absolute: 0.8 10*3/uL (ref 0.1–0.9)
Monocytes: 8 %
Neutrophils Absolute: 5.2 10*3/uL (ref 1.4–7.0)
Neutrophils: 52 %
Platelets: 742 10*3/uL — ABNORMAL HIGH (ref 150–450)
RBC: 4.8 x10E6/uL (ref 3.77–5.28)
RDW: 12.8 % (ref 11.7–15.4)
WBC: 10 10*3/uL (ref 3.4–10.8)

## 2022-04-14 LAB — LIPID PANEL
Chol/HDL Ratio: 3.3 ratio (ref 0.0–4.4)
Cholesterol, Total: 203 mg/dL — ABNORMAL HIGH (ref 100–199)
HDL: 62 mg/dL (ref >39)
LDL Chol Calc (NIH): 128 mg/dL — ABNORMAL HIGH (ref 0–99)
Triglycerides: 70 mg/dL (ref 0–149)
VLDL Cholesterol Cal: 13 mg/dL (ref 5–40)

## 2022-04-14 LAB — HEPATITIS C ANTIBODY: Hep C Virus Ab: NONREACTIVE

## 2022-04-14 LAB — TSH: TSH: 3.77 u[IU]/mL (ref 0.450–4.500)

## 2022-04-16 LAB — SPECIMEN STATUS REPORT

## 2022-04-16 LAB — HGB A1C W/O EAG: Hgb A1c MFr Bld: 6.3 % — ABNORMAL HIGH (ref 4.8–5.6)

## 2022-04-16 LAB — CYTOLOGY - PAP
Adequacy: ABSENT
Diagnosis: NEGATIVE

## 2022-05-03 DIAGNOSIS — Z419 Encounter for procedure for purposes other than remedying health state, unspecified: Secondary | ICD-10-CM | POA: Diagnosis not present

## 2022-06-02 DIAGNOSIS — Z419 Encounter for procedure for purposes other than remedying health state, unspecified: Secondary | ICD-10-CM | POA: Diagnosis not present

## 2022-07-03 DIAGNOSIS — Z419 Encounter for procedure for purposes other than remedying health state, unspecified: Secondary | ICD-10-CM | POA: Diagnosis not present

## 2022-07-15 ENCOUNTER — Ambulatory Visit (INDEPENDENT_AMBULATORY_CARE_PROVIDER_SITE_OTHER): Payer: Medicaid Other | Admitting: Family

## 2022-07-15 ENCOUNTER — Encounter: Payer: Self-pay | Admitting: Family

## 2022-07-15 VITALS — BP 149/96 | HR 86 | Temp 96.3°F | Ht 63.0 in | Wt 385.8 lb

## 2022-07-15 DIAGNOSIS — M25572 Pain in left ankle and joints of left foot: Secondary | ICD-10-CM | POA: Diagnosis not present

## 2022-07-15 DIAGNOSIS — I10 Essential (primary) hypertension: Secondary | ICD-10-CM

## 2022-07-15 DIAGNOSIS — F411 Generalized anxiety disorder: Secondary | ICD-10-CM | POA: Diagnosis not present

## 2022-07-15 DIAGNOSIS — Z713 Dietary counseling and surveillance: Secondary | ICD-10-CM

## 2022-07-15 LAB — CMP14+EGFR
ALT: 29 IU/L (ref 0–32)
AST: 25 IU/L (ref 0–40)
Albumin/Globulin Ratio: 1.5
Albumin: 4.3 g/dL (ref 3.9–4.9)
Alkaline Phosphatase: 110 IU/L (ref 44–121)
BUN/Creatinine Ratio: 26 — ABNORMAL HIGH (ref 9–23)
BUN: 18 mg/dL (ref 6–20)
Bilirubin Total: 0.3 mg/dL (ref 0.0–1.2)
CO2: 22 mmol/L (ref 20–29)
Calcium: 9.9 mg/dL (ref 8.7–10.2)
Chloride: 101 mmol/L (ref 96–106)
Creatinine, Ser: 0.69 mg/dL (ref 0.57–1.00)
Globulin, Total: 2.9 g/dL (ref 1.5–4.5)
Glucose: 113 mg/dL — ABNORMAL HIGH (ref 70–99)
Potassium: 4.7 mmol/L (ref 3.5–5.2)
Sodium: 140 mmol/L (ref 134–144)
Total Protein: 7.2 g/dL (ref 6.0–8.5)
eGFR: 119 mL/min/{1.73_m2} (ref >59)

## 2022-07-15 MED ORDER — PHENTERMINE HCL 37.5 MG PO CAPS: 37.5000 mg | ORAL_CAPSULE | Freq: Every morning | ORAL | 2 refills | Status: AC

## 2022-07-15 MED ORDER — NAPROXEN 500 MG PO TABS: 500.0000 mg | ORAL_TABLET | Freq: Two times a day (BID) | ORAL | 0 refills | Status: DC

## 2022-07-15 MED ORDER — AMLODIPINE BESYLATE 10 MG PO TABS: 10.0000 mg | ORAL_TABLET | Freq: Every day | ORAL | 2 refills | Status: AC

## 2022-07-15 NOTE — Progress Notes (Signed)
Subjective:    Patient ID: Paula Wells, female    DOB: 18-Sep-1990, 32 y.o.   MRN: [id number]  Chief Complaint  Patient presents with   Medical Management of Chronic Issues    fasting   PT presents to the office today for follow up on weight loss. She was seen 04/13/22 and started on phentermine. She has lost 11 lbs since our last visit.      07/15/2022    9:49 AM 04/13/2022    9:05 AM 03/27/2021   10:18 AM  Last 3 Weights  Weight (lbs) 385 lb 12.8 oz 396 lb 6.4 oz 391 lb 12.8 oz  Weight (kg) 174.998 kg 179.806 kg 177.719 kg     Hypertension This is a chronic problem. The current episode started more than 1 year ago. The problem is unchanged. The problem is uncontrolled. Pertinent negatives include no malaise/fatigue, peripheral edema or shortness of breath. Risk factors for coronary artery disease include dyslipidemia and obesity. The current treatment provides moderate improvement.  Ankle Pain  The incident occurred 12 to 24 hours ago. The injury mechanism was a fall. The pain is present in the left ankle. The pain is at a severity of 6/10. The pain is mild. Pertinent negatives include no muscle weakness, numbness or tingling. She reports no foreign bodies present. The symptoms are aggravated by weight bearing. She has tried NSAIDs for the symptoms. The treatment provided mild relief.      Review of Systems  Constitutional:  Negative for malaise/fatigue.  Respiratory:  Negative for shortness of breath.   Neurological:  Negative for tingling and numbness.  All other systems reviewed and are negative.      Objective:   Physical Exam Vitals reviewed.  Constitutional:      General: She is not in acute distress.    Appearance: She is well-developed. She is obese.  HENT:     Head: Normocephalic and atraumatic.     Right Ear: Tympanic membrane normal.     Left Ear: Tympanic membrane normal.  Eyes:     Pupils: Pupils are equal, round, and reactive to light.  Neck:      Thyroid: No thyromegaly.  Cardiovascular:     Rate and Rhythm: Normal rate and regular rhythm.     Heart sounds: Normal heart sounds. No murmur heard. Pulmonary:     Effort: Pulmonary effort is normal. No respiratory distress.     Breath sounds: Normal breath sounds. No wheezing.  Abdominal:     General: Bowel sounds are normal. There is no distension.     Palpations: Abdomen is soft.     Tenderness: There is no abdominal tenderness.  Musculoskeletal:        General: No tenderness. Normal range of motion.     Cervical back: Normal range of motion and neck supple.  Skin:    General: Skin is warm and dry.  Neurological:     Mental Status: She is alert and oriented to person, place, and time.     Cranial Nerves: No cranial nerve deficit.     Deep Tendon Reflexes: Reflexes are normal and symmetric.  Psychiatric:        Behavior: Behavior normal.        Thought Content: Thought content normal.        Judgment: Judgment normal.       BP (!) 149/96   Pulse 86   Temp (!) 96.3 F (35.7 C) (Temporal)   Ht 5\' 3"  (1.6 m)  Wt (!) 385 lb 12.8 oz (175 kg)   SpO2 98%   BMI 68.34 kg/m      Assessment & Plan:  Paula Wells comes in today with chief complaint of Medical Management of Chronic Issues (fasting)   Diagnosis and orders addressed:  1. Essential hypertension Will increase Norvasc to 10 mg from 5 mg  -Daily blood pressure log given with instructions on how to fill out and told to bring to next visit -Dash diet information given -Exercise encouraged - Stress Management  -Continue current meds -RTO in 3 months  - CMP14+EGFR - amLODipine (NORVASC) 10 MG tablet; Take 1 tablet (10 mg total) by mouth daily.  Dispense: 90 tablet; Refill: 2  2. GAD (generalized anxiety disorder) - CMP14+EGFR  3. Morbid obesity (HCC) - phentermine 37.5 MG capsule; Take 1 capsule (37.5 mg total) by mouth every morning.  Dispense: 30 capsule; Refill: 2 - CMP14+EGFR  4. Encounter for  weight loss counseling - phentermine 37.5 MG capsule; Take 1 capsule (37.5 mg total) by mouth every morning.  Dispense: 30 capsule; Refill: 2 - CMP14+EGFR  5. Acute left ankle pain -Start naprosyn BID with food  No other NSAID's  - naproxen (NAPROSYN) 500 MG tablet; Take 1 tablet (500 mg total) by mouth 2 (two) times daily with a meal.  Dispense: 30 tablet; Refill: 0   Labs pending Continue phentermine, healthy diet and exercise  Health Maintenance reviewed Diet and exercise encouraged  Follow up plan: 1 moth to recheck HTN    Jannifer Rodney, FNP

## 2022-07-15 NOTE — Patient Instructions (Signed)
Hypertension, Adult High blood pressure (hypertension) is when the force of blood pumping through the arteries is too strong. The arteries are the blood vessels that carry blood from the heart throughout the body. Hypertension forces the heart to work harder to pump blood and may cause arteries to become narrow or stiff. Untreated or uncontrolled hypertension can lead to a heart attack, heart failure, a stroke, kidney disease, and other problems. A blood pressure reading consists of a higher number over a lower number. Ideally, your blood pressure should be below 120/80. The first ("top") number is called the systolic pressure. It is a measure of the pressure in your arteries as your heart beats. The second ("bottom") number is called the diastolic pressure. It is a measure of the pressure in your arteries as the heart relaxes. What are the causes? The exact cause of this condition is not known. There are some conditions that result in high blood pressure. What increases the risk? Certain factors may make you more likely to develop high blood pressure. Some of these risk factors are under your control, including: Smoking. Not getting enough exercise or physical activity. Being overweight. Having too much fat, sugar, calories, or salt (sodium) in your diet. Drinking too much alcohol. Other risk factors include: Having a personal history of heart disease, diabetes, high cholesterol, or kidney disease. Stress. Having a family history of high blood pressure and high cholesterol. Having obstructive sleep apnea. Age. The risk increases with age. What are the signs or symptoms? High blood pressure may not cause symptoms. Very high blood pressure (hypertensive crisis) may cause: Headache. Fast or irregular heartbeats (palpitations). Shortness of breath. Nosebleed. Nausea and vomiting. Vision changes. Severe chest pain, dizziness, and seizures. How is this diagnosed? This condition is diagnosed by  measuring your blood pressure while you are seated, with your arm resting on a flat surface, your legs uncrossed, and your feet flat on the floor. The cuff of the blood pressure monitor will be placed directly against the skin of your upper arm at the level of your heart. Blood pressure should be measured at least twice using the same arm. Certain conditions can cause a difference in blood pressure between your right and left arms. If you have a high blood pressure reading during one visit or you have normal blood pressure with other risk factors, you may be asked to: Return on a different day to have your blood pressure checked again. Monitor your blood pressure at home for 1 week or longer. If you are diagnosed with hypertension, you may have other blood or imaging tests to help your health care provider understand your overall risk for other conditions. How is this treated? This condition is treated by making healthy lifestyle changes, such as eating healthy foods, exercising more, and reducing your alcohol intake. You may be referred for counseling on a healthy diet and physical activity. Your health care provider may prescribe medicine if lifestyle changes are not enough to get your blood pressure under control and if: Your systolic blood pressure is above 130. Your diastolic blood pressure is above 80. Your personal target blood pressure may vary depending on your medical conditions, your age, and other factors. Follow these instructions at home: Eating and drinking  Eat a diet that is high in fiber and potassium, and low in sodium, added sugar, and fat. An example of this eating plan is called the DASH diet. DASH stands for Dietary Approaches to Stop Hypertension. To eat this way: Eat   plenty of fresh fruits and vegetables. Try to fill one half of your plate at each meal with fruits and vegetables. Eat whole grains, such as whole-wheat pasta, brown rice, or whole-grain bread. Fill about one  fourth of your plate with whole grains. Eat or drink low-fat dairy products, such as skim milk or low-fat yogurt. Avoid fatty cuts of meat, processed or cured meats, and poultry with skin. Fill about one fourth of your plate with lean proteins, such as fish, chicken without skin, beans, eggs, or tofu. Avoid pre-made and processed foods. These tend to be higher in sodium, added sugar, and fat. Reduce your daily sodium intake. Many people with hypertension should eat less than 1,500 mg of sodium a day. Do not drink alcohol if: Your health care provider tells you not to drink. You are pregnant, may be pregnant, or are planning to become pregnant. If you drink alcohol: Limit how much you have to: 0-1 drink a day for women. 0-2 drinks a day for men. Know how much alcohol is in your drink. In the [place] one drink equals one 12 oz bottle of beer (355 mL), one 5 oz glass of wine (148 mL), or one 1 oz glass of hard liquor (44 mL). Lifestyle  Work with your health care provider to maintain a healthy body weight or to lose weight. Ask what an ideal weight is for you. Get at least 30 minutes of exercise that causes your heart to beat faster (aerobic exercise) most days of the week. Activities may include walking, swimming, or biking. Include exercise to strengthen your muscles (resistance exercise), such as Pilates or lifting weights, as part of your weekly exercise routine. Try to do these types of exercises for 30 minutes at least 3 days a week. Do not use any products that contain nicotine or tobacco. These products include cigarettes, chewing tobacco, and vaping devices, such as e-cigarettes. If you need help quitting, ask your health care provider. Monitor your blood pressure at home as told by your health care provider. Keep all follow-up visits. This is important. Medicines Take over-the-counter and prescription medicines only as told by your health care provider. Follow directions carefully. Blood  pressure medicines must be taken as prescribed. Do not skip doses of blood pressure medicine. Doing this puts you at risk for problems and can make the medicine less effective. Ask your health care provider about side effects or reactions to medicines that you should watch for. Contact a health care provider if you: Think you are having a reaction to a medicine you are taking. Have headaches that keep coming back (recurring). Feel dizzy. Have swelling in your ankles. Have trouble with your vision. Get help right away if you: Develop a severe headache or confusion. Have unusual weakness or numbness. Feel faint. Have severe pain in your chest or abdomen. Vomit repeatedly. Have trouble breathing. These symptoms may be an emergency. Get help right away. Call 911. Do not wait to see if the symptoms will go away. Do not drive yourself to the hospital. Summary Hypertension is when the force of blood pumping through your arteries is too strong. If this condition is not controlled, it may put you at risk for serious complications. Your personal target blood pressure may vary depending on your medical conditions, your age, and other factors. For most people, a normal blood pressure is less than 120/80. Hypertension is treated with lifestyle changes, medicines, or a combination of both. Lifestyle changes include losing weight, eating a healthy,   low-sodium diet, exercising more, and limiting alcohol. This information is not intended to replace advice given to you by your health care provider. Make sure you discuss any questions you have with your health care provider. Document Revised: 11/25/2020 Document Reviewed: 11/25/2020 Elsevier Patient Education  2024 Elsevier Inc.  

## 2022-08-02 DIAGNOSIS — Z419 Encounter for procedure for purposes other than remedying health state, unspecified: Secondary | ICD-10-CM | POA: Diagnosis not present

## 2022-08-16 ENCOUNTER — Ambulatory Visit: Payer: Medicaid Other | Admitting: Family

## 2022-08-30 ENCOUNTER — Ambulatory Visit: Payer: Medicaid Other | Admitting: Family

## 2022-09-02 ENCOUNTER — Encounter (INDEPENDENT_AMBULATORY_CARE_PROVIDER_SITE_OTHER): Payer: Self-pay

## 2022-09-02 DIAGNOSIS — Z419 Encounter for procedure for purposes other than remedying health state, unspecified: Secondary | ICD-10-CM | POA: Diagnosis not present

## 2022-10-03 DIAGNOSIS — Z419 Encounter for procedure for purposes other than remedying health state, unspecified: Secondary | ICD-10-CM | POA: Diagnosis not present

## 2022-11-02 DIAGNOSIS — Z419 Encounter for procedure for purposes other than remedying health state, unspecified: Secondary | ICD-10-CM | POA: Diagnosis not present

## 2022-12-03 DIAGNOSIS — Z419 Encounter for procedure for purposes other than remedying health state, unspecified: Secondary | ICD-10-CM | POA: Diagnosis not present

## 2023-01-02 DIAGNOSIS — Z419 Encounter for procedure for purposes other than remedying health state, unspecified: Secondary | ICD-10-CM | POA: Diagnosis not present

## 2023-02-02 DIAGNOSIS — Z419 Encounter for procedure for purposes other than remedying health state, unspecified: Secondary | ICD-10-CM | POA: Diagnosis not present

## 2023-02-21 ENCOUNTER — Encounter: Payer: Medicaid Other | Admitting: Family

## 2023-02-28 ENCOUNTER — Telehealth: Payer: Self-pay | Admitting: Family

## 2023-03-05 DIAGNOSIS — Z419 Encounter for procedure for purposes other than remedying health state, unspecified: Secondary | ICD-10-CM | POA: Diagnosis not present

## 2023-03-18 ENCOUNTER — Encounter: Payer: Medicaid Other | Admitting: Family

## 2023-04-02 DIAGNOSIS — Z419 Encounter for procedure for purposes other than remedying health state, unspecified: Secondary | ICD-10-CM | POA: Diagnosis not present

## 2023-05-14 DIAGNOSIS — Z419 Encounter for procedure for purposes other than remedying health state, unspecified: Secondary | ICD-10-CM | POA: Diagnosis not present

## 2023-06-13 DIAGNOSIS — Z419 Encounter for procedure for purposes other than remedying health state, unspecified: Secondary | ICD-10-CM | POA: Diagnosis not present

## 2023-07-14 DIAGNOSIS — Z419 Encounter for procedure for purposes other than remedying health state, unspecified: Secondary | ICD-10-CM | POA: Diagnosis not present

## 2023-08-12 ENCOUNTER — Ambulatory Visit: Admitting: Family

## 2023-08-13 DIAGNOSIS — Z419 Encounter for procedure for purposes other than remedying health state, unspecified: Secondary | ICD-10-CM | POA: Diagnosis not present

## 2023-09-13 DIAGNOSIS — Z419 Encounter for procedure for purposes other than remedying health state, unspecified: Secondary | ICD-10-CM | POA: Diagnosis not present

## 2023-09-16 ENCOUNTER — Ambulatory Visit (INDEPENDENT_AMBULATORY_CARE_PROVIDER_SITE_OTHER): Admitting: Family

## 2023-09-16 ENCOUNTER — Encounter: Payer: Self-pay | Admitting: Family

## 2023-09-16 VITALS — BP 143/94 | HR 94 | Temp 98.6°F | Ht 63.0 in | Wt 379.6 lb

## 2023-09-16 DIAGNOSIS — H66002 Acute suppurative otitis media without spontaneous rupture of ear drum, left ear: Secondary | ICD-10-CM

## 2023-09-16 DIAGNOSIS — H6062 Unspecified chronic otitis externa, left ear: Secondary | ICD-10-CM | POA: Diagnosis not present

## 2023-09-16 MED ORDER — AMOXICILLIN-POT CLAVULANATE 875-125 MG PO TABS: 1.0000 | ORAL_TABLET | Freq: Two times a day (BID) | ORAL | 0 refills | Status: DC

## 2023-09-16 MED ORDER — CIPROFLOXACIN-DEXAMETHASONE 0.3-0.1 % OT SUSP: 4.0000 [drp] | Freq: Two times a day (BID) | OTIC | 0 refills | Status: DC

## 2023-09-16 NOTE — Progress Notes (Signed)
 Subjective:    Patient ID: Paula Wells, female    DOB: 12/12/1990, 33 y.o.   MRN: [id number]  Chief Complaint  Patient presents with   Ear Problem    Leaking fluid    Pt presents to the office today with left ear drainage that started over a year ago that comes and goes. Reports she has taken OTC sinus medications. Reports decrease hearing and ear fullness.  Ear Drainage  There is pain in the left ear. This is a chronic problem. The current episode started more than 1 year ago. The problem has been waxing and waning. There has been no fever. The pain is at a severity of 0/10. The patient is experiencing no pain. Associated symptoms include ear discharge and hearing loss. Pertinent negatives include no coughing, diarrhea, headaches, rhinorrhea, sore throat or vomiting. Associated symptoms comments: Ear fullness. She has tried acetaminophen and NSAIDs for the symptoms. The treatment provided mild relief.      Review of Systems  HENT:  Positive for ear discharge and hearing loss. Negative for rhinorrhea and sore throat.   Respiratory:  Negative for cough.   Gastrointestinal:  Negative for diarrhea and vomiting.  Neurological:  Negative for headaches.  All other systems reviewed and are negative.   Social History   Socioeconomic History   Marital status: Single    Spouse name: Not on file   Number of children: Not on file   Years of education: Not on file   Highest education level: Bachelor's degree (e.g., BA, AB, BS)  Occupational History   Not on file  Tobacco Use   Smoking status: Never   Smokeless tobacco: Never  Substance and Sexual Activity   Alcohol use: No    Alcohol/week: 0.0 standard drinks of alcohol   Drug use: No   Sexual activity: Never  Other Topics Concern   Not on file  Social History Narrative   Not on file   Social Drivers of Health   Financial Resource Strain: Low Risk  (09/15/2023)   Overall Financial Resource Strain (CARDIA)    Difficulty of  Paying Living Expenses: Not very hard  Food Insecurity: Patient Declined (09/15/2023)   Hunger Vital Sign    Worried About Running Out of Food in the Last Year: Patient declined    Ran Out of Food in the Last Year: Patient declined  Transportation Needs: No Transportation Needs (09/15/2023)   PRAPARE - Administrator, Civil Service (Medical): No    Lack of Transportation (Non-Medical): No  Physical Activity: Insufficiently Active (09/15/2023)   Exercise Vital Sign    Days of Exercise per Week: 2 days    Minutes of Exercise per Session: 30 min  Stress: Stress Concern Present (09/15/2023)   [employer] of Occupational Health - Occupational Stress Questionnaire    Feeling of Stress: Very much  Social Connections: Moderately Isolated (09/15/2023)   Social Connection and Isolation Panel    Frequency of Communication with Friends and Family: More than three times a week    Frequency of Social Gatherings with Friends and Family: Once a week    Attends Religious Services: 1 to 4 times per year    Active Member of [employer] or Organizations: No    Attends Engineer, structural: Not on file    Marital Status: Never married   Family History  Problem Relation Age of Onset   Diabetes Mother    Birth defects Brother  Objective:   Physical Exam Vitals reviewed.  Constitutional:      General: She is not in acute distress.    Appearance: She is well-developed.  HENT:     Head: Normocephalic and atraumatic.     Right Ear: There is impacted cerumen.     Left Ear: Drainage and swelling present. Tympanic membrane is erythematous.  Eyes:     Pupils: Pupils are equal, round, and reactive to light.  Neck:     Thyroid : No thyromegaly.  Cardiovascular:     Rate and Rhythm: Normal rate and regular rhythm.     Heart sounds: Normal heart sounds. No murmur heard. Pulmonary:     Effort: Pulmonary effort is normal. No respiratory distress.     Breath sounds: Normal breath  sounds. No wheezing.  Abdominal:     General: Bowel sounds are normal. There is no distension.     Palpations: Abdomen is soft.     Tenderness: There is no abdominal tenderness.  Musculoskeletal:        General: No tenderness. Normal range of motion.     Cervical back: Normal range of motion and neck supple.  Skin:    General: Skin is warm and dry.  Neurological:     Mental Status: She is alert and oriented to person, place, and time.     Cranial Nerves: No cranial nerve deficit.     Deep Tendon Reflexes: Reflexes are normal and symmetric.  Psychiatric:        Behavior: Behavior normal.        Thought Content: Thought content normal.        Judgment: Judgment normal.       BP (!) 143/94   Pulse 94   Temp 98.6 F (37 C) (Temporal)   Ht 5' 3 (1.6 m)   Wt (!) 379 lb 9.6 oz (172.2 kg)   BMI 67.24 kg/m      Assessment & Plan:  Paula Wells comes in today with chief complaint of Ear Problem (Leaking fluid )   Diagnosis and orders addressed:  1. Non-recurrent acute suppurative otitis media of left ear without spontaneous rupture of tympanic membrane (Primary) - amoxicillin -clavulanate (AUGMENTIN ) 875-125 MG tablet; Take 1 tablet by mouth 2 (two) times daily.  Dispense: 14 tablet; Refill: 0  2. Chronic otitis externa of left ear, unspecified type - amoxicillin -clavulanate (AUGMENTIN ) 875-125 MG tablet; Take 1 tablet by mouth 2 (two) times daily.  Dispense: 14 tablet; Refill: 0 - ciprofloxacin -dexamethasone  (CIPRODEX ) OTIC suspension; Place 4 drops into the left ear 2 (two) times daily.  Dispense: 7.5 mL; Refill: 0   Start Augmentin  BID for 7 days Ciprodex   Keep clean and dry Avoid sticking anything into ear Tylenol as needed Follow up in 2 weeks to recheck, may need referral to ENT    Bari Learn, FNP

## 2023-09-16 NOTE — Patient Instructions (Signed)

## 2023-10-14 DIAGNOSIS — Z419 Encounter for procedure for purposes other than remedying health state, unspecified: Secondary | ICD-10-CM | POA: Diagnosis not present

## 2023-11-10 ENCOUNTER — Encounter: Payer: Self-pay | Admitting: Family

## 2023-11-10 ENCOUNTER — Ambulatory Visit: Admitting: Family

## 2023-11-10 VITALS — BP 129/86 | HR 87 | Temp 97.6°F | Ht 63.0 in | Wt 385.0 lb

## 2023-11-10 DIAGNOSIS — R42 Dizziness and giddiness: Secondary | ICD-10-CM

## 2023-11-10 DIAGNOSIS — Z23 Encounter for immunization: Secondary | ICD-10-CM | POA: Diagnosis not present

## 2023-11-10 MED ORDER — ONDANSETRON HCL 4 MG PO TABS: 4.0000 mg | ORAL_TABLET | Freq: Three times a day (TID) | ORAL | 0 refills | Status: AC | PRN

## 2023-11-10 MED ORDER — MECLIZINE HCL 25 MG PO TABS: 25.0000 mg | ORAL_TABLET | Freq: Three times a day (TID) | ORAL | 0 refills | Status: AC | PRN

## 2023-11-10 NOTE — Patient Instructions (Signed)
 Benign Positional Vertigo Vertigo is the feeling that you or your surroundings are moving when they are not. Benign positional vertigo is the most common form of vertigo. This is usually a harmless condition (benign). This condition is positional. This means that symptoms are triggered by certain movements and positions. This condition can be dangerous if it occurs while you are doing something that could cause harm to yourself or others. This includes activities such as driving or operating machinery. What are the causes? The inner ear has fluid-filled canals that help your brain sense movement and balance. When the fluid moves, the brain receives messages about your body's position. With benign positional vertigo, calcium crystals in the inner ear break free and disturb the inner ear area. This causes your brain to receive confusing messages about your body's position. What increases the risk? You are more likely to develop this condition if: You are a woman. You are 32 years of age or older. You have recently had a head injury. You have an inner ear disease. What are the signs or symptoms? Symptoms of this condition usually happen when you move your head or your eyes in different directions. Symptoms may start suddenly and usually last for less than a minute. They include: Loss of balance and falling. Feeling like you are spinning or moving. Feeling like your surroundings are spinning or moving. Nausea and vomiting. Blurred vision. Dizziness. Involuntary eye movement (nystagmus). Symptoms can be mild and cause only minor problems, or they can be severe and interfere with daily life. Episodes of benign positional vertigo may return (recur) over time. Symptoms may also improve over time. How is this diagnosed? This condition may be diagnosed based on: Your medical history. A physical exam of the head, neck, and ears. Positional tests to check for or stimulate vertigo. You may be asked to  turn your head and change positions, such as going from sitting to lying down. A health care provider will watch for symptoms of vertigo. You may be referred to a health care provider who specializes in ear, nose, and throat problems (ENT or otolaryngologist) or a provider who specializes in disorders of the nervous system (neurologist). How is this treated?  This condition may be treated in a session in which your health care provider moves your head in specific positions to help the displaced crystals in your inner ear move. Treatment for this condition may take several sessions. Surgery may be needed in severe cases, but this is rare. In some cases, benign positional vertigo may resolve on its own in 2-4 weeks. Follow these instructions at home: Safety Move slowly. Avoid sudden body or head movements or certain positions, as told by your health care provider. Avoid driving or operating machinery until your health care provider says it is safe. Avoid doing any tasks that would be dangerous to you or others if vertigo occurs. If you have trouble walking or keeping your balance, try using a cane for stability. If you feel dizzy or unstable, sit down right away. Return to your normal activities as told by your health care provider. Ask your health care provider what activities are safe for you. General instructions Take over-the-counter and prescription medicines only as told by your health care provider. Drink enough fluid to keep your urine pale yellow. Keep all follow-up visits. This is important. Contact a health care provider if: You have a fever. Your condition gets worse or you develop new symptoms. Your family or friends notice any behavioral changes.  You have nausea or vomiting that gets worse. You have numbness or a prickling and tingling sensation. Get help right away if you: Have difficulty speaking or moving. Are always dizzy or faint. Develop severe headaches. Have weakness in  your legs or arms. Have changes in your hearing or vision. Develop a stiff neck. Develop sensitivity to light. These symptoms may represent a serious problem that is an emergency. Do not wait to see if the symptoms will go away. Get medical help right away. Call your local emergency services (911 in the [place] Do not drive yourself to the hospital. Summary Vertigo is the feeling that you or your surroundings are moving when they are not. Benign positional vertigo is the most common form of vertigo. This condition is caused by calcium crystals in the inner ear that become displaced. This causes a disturbance in an area of the inner ear that helps your brain sense movement and balance. Symptoms include loss of balance and falling, feeling that you or your surroundings are moving, nausea and vomiting, and blurred vision. This condition can be diagnosed based on symptoms, a physical exam, and positional tests. Follow safety instructions as told by your health care provider and keep all follow-up visits. This is important. This information is not intended to replace advice given to you by your health care provider. Make sure you discuss any questions you have with your health care provider. Document Revised: 08/09/2022 Document Reviewed: 08/09/2022 Elsevier Patient Education  2024 [employer].

## 2023-11-10 NOTE — Progress Notes (Signed)
 Subjective:    Patient ID: Paula Wells, female    DOB: [birth year], 33 y.o.   MRN: [id number]  Chief Complaint  Patient presents with   Recheck inner ear    Still having dizzy spells    Pt presents to the office today with dizziness for the last two days. She had an left ear infection 09/16/23. Reports her ear pain has resolved.   Reports when she would roll over in bed she would get dizzy.  Dizziness This is a new problem. The current episode started in the past 7 days. The problem occurs intermittently. The problem has been unchanged. Associated symptoms include nausea and vertigo. Pertinent negatives include no anorexia, arthralgias, congestion, coughing, fatigue, fever or headaches. The symptoms are aggravated by bending. She has tried relaxation (dramamine) for the symptoms. The treatment provided mild relief.      Review of Systems  Constitutional:  Negative for fatigue and fever.  HENT:  Negative for congestion.   Respiratory:  Negative for cough.   Gastrointestinal:  Positive for nausea. Negative for anorexia.  Musculoskeletal:  Negative for arthralgias.  Neurological:  Positive for dizziness and vertigo. Negative for headaches.  All other systems reviewed and are negative.   Social History   Socioeconomic History   Marital status: Single    Spouse name: Not on file   Number of children: Not on file   Years of education: Not on file   Highest education level: Bachelor's degree (e.g., BA, AB, BS)  Occupational History   Not on file  Tobacco Use   Smoking status: Never   Smokeless tobacco: Never  Substance and Sexual Activity   Alcohol use: No    Alcohol/week: 0.0 standard drinks of alcohol   Drug use: No   Sexual activity: Never  Other Topics Concern   Not on file  Social History Narrative   Not on file   Social Drivers of Health   Financial Resource Strain: Low Risk  (09/15/2023)   Overall Financial Resource Strain (CARDIA)    Difficulty of Paying  Living Expenses: Not very hard  Food Insecurity: Patient Declined (09/15/2023)   Hunger Vital Sign    Worried About Running Out of Food in the Last Year: Patient declined    Ran Out of Food in the Last Year: Patient declined  Transportation Needs: No Transportation Needs (09/15/2023)   PRAPARE - Administrator, Civil Service (Medical): No    Lack of Transportation (Non-Medical): No  Physical Activity: Insufficiently Active (09/15/2023)   Exercise Vital Sign    Days of Exercise per Week: 2 days    Minutes of Exercise per Session: 30 min  Stress: Stress Concern Present (09/15/2023)   [employer] of Occupational Health - Occupational Stress Questionnaire    Feeling of Stress: Very much  Social Connections: Moderately Isolated (09/15/2023)   Social Connection and Isolation Panel    Frequency of Communication with Friends and Family: More than three times a week    Frequency of Social Gatherings with Friends and Family: Once a week    Attends Religious Services: 1 to 4 times per year    Active Member of [employer] or Organizations: No    Attends Engineer, structural: Not on file    Marital Status: Never married   Family History  Problem Relation Age of Onset   Diabetes Mother    Birth defects Brother         Objective:   Physical Exam Vitals reviewed.  Constitutional:      General: She is not in acute distress.    Appearance: She is well-developed.  HENT:     Head: Normocephalic and atraumatic.     Right Ear: There is impacted cerumen.     Left Ear: Tympanic membrane normal. A PE tube is present.  Eyes:     Pupils: Pupils are equal, round, and reactive to light.  Neck:     Thyroid : No thyromegaly.  Cardiovascular:     Rate and Rhythm: Normal rate and regular rhythm.     Heart sounds: Normal heart sounds. No murmur heard. Pulmonary:     Effort: Pulmonary effort is normal. No respiratory distress.     Breath sounds: Normal breath sounds. No wheezing.   Abdominal:     General: Bowel sounds are normal. There is no distension.     Palpations: Abdomen is soft.     Tenderness: There is no abdominal tenderness.  Musculoskeletal:        General: No tenderness. Normal range of motion.     Cervical back: Normal range of motion and neck supple.  Skin:    General: Skin is warm and dry.  Neurological:     Mental Status: She is alert and oriented to person, place, and time.     Cranial Nerves: No cranial nerve deficit.     Deep Tendon Reflexes: Reflexes are normal and symmetric.  Psychiatric:        Behavior: Behavior normal.        Thought Content: Thought content normal.        Judgment: Judgment normal.       BP 129/86   Pulse 87   Temp 97.6 F (36.4 C) (Temporal)   Ht 5' 3 (1.6 m)   Wt (!) 385 lb (174.6 kg)   SpO2 98%   BMI 68.20 kg/m      Assessment & Plan:  Sharia Averitt comes in today with chief complaint of Recheck inner ear (Still having dizzy spells/)   Diagnosis and orders addressed:  1. Vertigo (Primary) Fall precautions  Epley exercises discussed, handout given  Antivert TID prn  Zofran as needed Labs pending  Follow up if symptoms worsen or do not improve  - meclizine (ANTIVERT) 25 MG tablet; Take 1 tablet (25 mg total) by mouth 3 (three) times daily as needed for dizziness.  Dispense: 30 tablet; Refill: 0 - ondansetron (ZOFRAN) 4 MG tablet; Take 1 tablet (4 mg total) by mouth every 8 (eight) hours as needed for nausea or vomiting.  Dispense: 20 tablet; Refill: 0 - Anemia Profile B - CMP14+EGFR - TSH      Bari Learn, FNP

## 2023-11-11 ENCOUNTER — Ambulatory Visit: Payer: Self-pay | Admitting: Family

## 2023-11-11 DIAGNOSIS — Z713 Dietary counseling and surveillance: Secondary | ICD-10-CM

## 2023-11-11 DIAGNOSIS — I10 Essential (primary) hypertension: Secondary | ICD-10-CM

## 2023-11-11 LAB — ANEMIA PROFILE B
Basophils Absolute: 0.1 x10E3/uL (ref 0.0–0.2)
Basos: 1 %
EOS (ABSOLUTE): 0.1 x10E3/uL (ref 0.0–0.4)
Eos: 2 %
Ferritin: 45 ng/mL (ref 15–150)
Folate: 5.1 ng/mL (ref >3.0)
Hematocrit: 38.6 % (ref 34.0–46.6)
Hemoglobin: 11.9 g/dL (ref 11.1–15.9)
Immature Grans (Abs): 0 x10E3/uL (ref 0.0–0.1)
Immature Granulocytes: 0 %
Iron Saturation: 13 % — ABNORMAL LOW (ref 15–55)
Iron: 50 ug/dL (ref 27–159)
Lymphocytes Absolute: 3.2 x10E3/uL — ABNORMAL HIGH (ref 0.7–3.1)
Lymphs: 34 %
MCH: 25.9 pg — ABNORMAL LOW (ref 26.6–33.0)
MCHC: 30.8 g/dL — ABNORMAL LOW (ref 31.5–35.7)
MCV: 84 fL (ref 79–97)
Monocytes Absolute: 1 x10E3/uL — ABNORMAL HIGH (ref 0.1–0.9)
Monocytes: 10 %
Neutrophils Absolute: 4.9 x10E3/uL (ref 1.4–7.0)
Neutrophils: 53 %
Platelets: 661 x10E3/uL — ABNORMAL HIGH (ref 150–450)
RBC: 4.59 x10E6/uL (ref 3.77–5.28)
RDW: 13.4 % (ref 11.7–15.4)
Retic Ct Pct: 1.5 % (ref 0.6–2.6)
Total Iron Binding Capacity: 395 ug/dL (ref 250–450)
UIBC: 345 ug/dL (ref 131–425)
Vitamin B-12: 438 pg/mL (ref 232–1245)
WBC: 9.2 x10E3/uL (ref 3.4–10.8)

## 2023-11-11 LAB — CMP14+EGFR
ALT: 13 IU/L (ref 0–32)
AST: 19 IU/L (ref 0–40)
Albumin: 4.1 g/dL (ref 3.9–4.9)
Alkaline Phosphatase: 84 IU/L (ref 41–116)
BUN/Creatinine Ratio: 15 (ref 9–23)
BUN: 11 mg/dL (ref 6–20)
Bilirubin Total: 0.3 mg/dL (ref 0.0–1.2)
CO2: 22 mmol/L (ref 20–29)
Calcium: 9.3 mg/dL (ref 8.7–10.2)
Chloride: 100 mmol/L (ref 96–106)
Creatinine, Ser: 0.74 mg/dL (ref 0.57–1.00)
Globulin, Total: 2.8 g/dL (ref 1.5–4.5)
Glucose: 100 mg/dL — ABNORMAL HIGH (ref 70–99)
Potassium: 4.7 mmol/L (ref 3.5–5.2)
Sodium: 137 mmol/L (ref 134–144)
Total Protein: 6.9 g/dL (ref 6.0–8.5)
eGFR: 110 mL/min/1.73 (ref >59)

## 2023-11-11 LAB — TSH: TSH: 4.31 u[IU]/mL (ref 0.450–4.500)

## 2023-11-15 MED ORDER — WEGOVY 0.25 MG/0.5ML ~~LOC~~ SOAJ
0.2500 mg | SUBCUTANEOUS | 1 refills | Status: AC
Start: 2023-11-15 — End: ?

## 2023-11-16 ENCOUNTER — Other Ambulatory Visit (HOSPITAL_COMMUNITY): Payer: Self-pay

## 2023-11-16 ENCOUNTER — Telehealth: Payer: Self-pay

## 2023-11-16 NOTE — Telephone Encounter (Signed)
 Pharmacy Patient Advocate Encounter   Received notification from Onbase that prior authorization for Wegovy 0.25 mg/0.5 ml auto injectors is required/requested.   Insurance verification completed.   The patient is insured through [employer].   Per test claim: Effective October 1st, Medicaid will discontinue coverage of GLP1 medications for weight loss (such as Wegovy and Zepbound), unless the patient has a documented history of a heart attack or stroke. Zepbound will continue to be covered only for patients with moderate to severe sleep apnea (AHI 15-30) and a BMI greater than 40. Because of this change, the prior authorization team will not be submitting new PA requests for GLP1 medications prescribed for weight loss, as patients will be unable to continue therapy under Medicaid coverage.

## 2023-11-17 NOTE — Telephone Encounter (Signed)
 LMTCB to schedule appt

## 2023-11-17 NOTE — Telephone Encounter (Signed)
 Can you make patient a virtual visit to discuss weight loss medications.

## 2023-11-24 DIAGNOSIS — F331 Major depressive disorder, recurrent, moderate: Secondary | ICD-10-CM | POA: Diagnosis not present

## 2023-12-07 DIAGNOSIS — F332 Major depressive disorder, recurrent severe without psychotic features: Secondary | ICD-10-CM | POA: Diagnosis not present

## 2023-12-20 DIAGNOSIS — F331 Major depressive disorder, recurrent, moderate: Secondary | ICD-10-CM | POA: Diagnosis not present

## 2023-12-22 DIAGNOSIS — F332 Major depressive disorder, recurrent severe without psychotic features: Secondary | ICD-10-CM | POA: Diagnosis not present

## 2024-01-03 DIAGNOSIS — F331 Major depressive disorder, recurrent, moderate: Secondary | ICD-10-CM | POA: Diagnosis not present

## 2024-01-10 DIAGNOSIS — F331 Major depressive disorder, recurrent, moderate: Secondary | ICD-10-CM | POA: Diagnosis not present

## 2024-03-16 ENCOUNTER — Ambulatory Visit: Admitting: Family
